# Patient Record
Sex: Female | Born: 1975 | Race: White | Hispanic: No | Marital: Married | Smoking: Never smoker
Health system: Southern US, Community
[De-identification: ages and names within clinical notes are randomized; demographics above are authoritative.]

## PROBLEM LIST (undated history)

## (undated) DIAGNOSIS — Z789 Other specified health status: Secondary | ICD-10-CM

## (undated) DIAGNOSIS — N83201 Unspecified ovarian cyst, right side: Secondary | ICD-10-CM

## (undated) DIAGNOSIS — R569 Unspecified convulsions: Secondary | ICD-10-CM

## (undated) DIAGNOSIS — H539 Unspecified visual disturbance: Secondary | ICD-10-CM

## (undated) DIAGNOSIS — E559 Vitamin D deficiency, unspecified: Secondary | ICD-10-CM

## (undated) DIAGNOSIS — K900 Celiac disease: Secondary | ICD-10-CM

## (undated) DIAGNOSIS — Z973 Presence of spectacles and contact lenses: Secondary | ICD-10-CM

## (undated) HISTORY — PX: MOUTH SURGERY: SHX715

## (undated) HISTORY — DX: Unspecified convulsions: R56.9

## (undated) HISTORY — DX: Unspecified visual disturbance: H53.9

---

## 2000-08-26 ENCOUNTER — Other Ambulatory Visit: Admission: RE | Admit: 2000-08-26 | Discharge: 2000-08-26 | Payer: Self-pay | Admitting: Obstetrics and Gynecology

## 2001-09-27 ENCOUNTER — Other Ambulatory Visit: Admission: RE | Admit: 2001-09-27 | Discharge: 2001-09-27 | Payer: Self-pay | Admitting: Obstetrics and Gynecology

## 2002-10-26 ENCOUNTER — Other Ambulatory Visit: Admission: RE | Admit: 2002-10-26 | Discharge: 2002-10-26 | Payer: Self-pay | Admitting: Obstetrics and Gynecology

## 2003-11-22 ENCOUNTER — Other Ambulatory Visit: Admission: RE | Admit: 2003-11-22 | Discharge: 2003-11-22 | Payer: Self-pay | Admitting: Obstetrics and Gynecology

## 2004-05-15 ENCOUNTER — Other Ambulatory Visit: Admission: RE | Admit: 2004-05-15 | Discharge: 2004-05-15 | Payer: Self-pay | Admitting: Obstetrics and Gynecology

## 2004-10-02 ENCOUNTER — Other Ambulatory Visit: Admission: RE | Admit: 2004-10-02 | Discharge: 2004-10-02 | Payer: Self-pay | Admitting: Obstetrics and Gynecology

## 2004-12-18 ENCOUNTER — Other Ambulatory Visit: Admission: RE | Admit: 2004-12-18 | Discharge: 2004-12-18 | Payer: Self-pay | Admitting: Obstetrics and Gynecology

## 2005-06-11 ENCOUNTER — Other Ambulatory Visit: Admission: RE | Admit: 2005-06-11 | Discharge: 2005-06-11 | Payer: Self-pay | Admitting: Obstetrics and Gynecology

## 2006-01-28 ENCOUNTER — Other Ambulatory Visit: Admission: RE | Admit: 2006-01-28 | Discharge: 2006-01-28 | Payer: Self-pay | Admitting: Obstetrics and Gynecology

## 2010-04-17 ENCOUNTER — Encounter: Admission: RE | Admit: 2010-04-17 | Discharge: 2010-04-17 | Payer: Self-pay | Admitting: Gastroenterology

## 2010-11-05 ENCOUNTER — Emergency Department (HOSPITAL_COMMUNITY)
Admission: EM | Admit: 2010-11-05 | Discharge: 2010-11-06 | Payer: Self-pay | Source: Home / Self Care | Admitting: Emergency Medicine

## 2010-11-05 LAB — URINALYSIS, ROUTINE W REFLEX MICROSCOPIC
Bilirubin Urine: NEGATIVE
Hgb urine dipstick: NEGATIVE
Nitrite: NEGATIVE
Protein, ur: NEGATIVE mg/dL
Specific Gravity, Urine: 1.012 (ref 1.005–1.030)
Urine Glucose, Fasting: NEGATIVE mg/dL

## 2010-11-05 LAB — COMPREHENSIVE METABOLIC PANEL
Calcium: 9.4 mg/dL (ref 8.4–10.5)
Chloride: 106 mEq/L (ref 96–112)
GFR calc non Af Amer: >60 mL/min (ref >60)
Glucose, Bld: 98 mg/dL (ref 70–99)
Potassium: 4.1 mEq/L (ref 3.5–5.1)
Total Protein: 7 g/dL (ref 6.0–8.3)

## 2010-11-05 LAB — URINE MICROSCOPIC-ADD ON

## 2010-11-05 LAB — DIFFERENTIAL
Basophils Relative: 0 % (ref 0–1)
Eosinophils Relative: 2 % (ref 0–5)
Monocytes Absolute: 0.8 10*3/uL (ref 0.1–1.0)
Neutro Abs: 5.8 10*3/uL (ref 1.7–7.7)
Neutrophils Relative %: 63 % (ref 43–77)

## 2010-11-05 LAB — CBC
MCV: 93 fL (ref 78.0–100.0)
Platelets: 224 10*3/uL (ref 150–400)
RBC: 4.55 MIL/uL (ref 3.87–5.11)

## 2011-12-21 ENCOUNTER — Encounter (HOSPITAL_COMMUNITY): Payer: Self-pay | Admitting: Emergency Medicine

## 2011-12-21 ENCOUNTER — Emergency Department (HOSPITAL_COMMUNITY)
Admission: EM | Admit: 2011-12-21 | Discharge: 2011-12-21 | Disposition: A | Discharge status: Home Health | Payer: BC Managed Care – PPO | Attending: Emergency Medicine | Admitting: Emergency Medicine

## 2011-12-21 DIAGNOSIS — Z0389 Encounter for observation for other suspected diseases and conditions ruled out: Secondary | ICD-10-CM | POA: Insufficient documentation

## 2011-12-21 DIAGNOSIS — K900 Celiac disease: Secondary | ICD-10-CM | POA: Insufficient documentation

## 2011-12-21 HISTORY — DX: Celiac disease: K90.0

## 2011-12-21 LAB — URINE MICROSCOPIC-ADD ON

## 2011-12-21 LAB — COMPREHENSIVE METABOLIC PANEL
ALT: 15 U/L (ref 0–35)
GFR calc non Af Amer: 88 mL/min — ABNORMAL LOW (ref >90)
Total Bilirubin: 0.1 mg/dL — ABNORMAL LOW (ref 0.3–1.2)

## 2011-12-21 LAB — URINALYSIS, ROUTINE W REFLEX MICROSCOPIC
Glucose, UA: NEGATIVE mg/dL
Hgb urine dipstick: NEGATIVE
Ketones, ur: 15 mg/dL — AB
Protein, ur: 100 mg/dL — AB
Specific Gravity, Urine: 1.034 — ABNORMAL HIGH (ref 1.005–1.030)
Urobilinogen, UA: 0.2 mg/dL (ref 0.0–1.0)

## 2011-12-21 LAB — CBC
Hemoglobin: 17 g/dL — ABNORMAL HIGH (ref 12.0–15.0)
MCHC: 34.4 g/dL (ref 30.0–36.0)
MCV: 93 fL (ref 78.0–100.0)
Platelets: 238 10*3/uL (ref 150–400)

## 2011-12-21 LAB — DIFFERENTIAL
Basophils Relative: 0 % (ref 0–1)
Eosinophils Absolute: 0 10*3/uL (ref 0.0–0.7)
Eosinophils Relative: 0 % (ref 0–5)
Lymphocytes Relative: 19 % (ref 12–46)
Monocytes Absolute: 0.8 10*3/uL (ref 0.1–1.0)
Monocytes Relative: 9 % (ref 3–12)

## 2011-12-21 LAB — POCT PREGNANCY, URINE: Preg Test, Ur: NEGATIVE

## 2011-12-21 NOTE — ED Notes (Signed)
Pt c/o N/V/D x 3 days; pt sts abd tenderness in RLQ

## 2014-08-22 ENCOUNTER — Encounter (HOSPITAL_COMMUNITY): Payer: Self-pay | Admitting: *Deleted

## 2014-08-23 ENCOUNTER — Encounter (HOSPITAL_COMMUNITY): Payer: Self-pay | Admitting: *Deleted

## 2014-08-23 NOTE — Progress Notes (Signed)
NPO AFTER MN. ARRIVE AT 0600. NEEDS HG AND URINE PREG.  PRE-OP ORDERS PENDING. 

## 2014-08-26 NOTE — H&P (Addendum)
Jordan Snyder is an 38 y.o. female. G0 with right ovarian mass, suspect dermoid, that presents for surgical mngt.  No pain.  Menses regular.    Patient's last menstrual period was 08/22/2014 (exact date).    Past Medical History  Diagnosis Date  . Celiac disease   . Right ovarian cyst   . Gluten free diet   . Vitamin D deficiency   . Wears contact lenses     History reviewed. No pertinent past surgical history.  History reviewed. No pertinent family history.  Social History:  reports that she has never smoked. She has never used smokeless tobacco. She reports that she drinks alcohol. She reports that she does not use illicit drugs.  Allergies: No Known Allergies  No prescriptions prior to admission    ROS  Height 5' 1"  (1.549 m), weight 65.772 kg (145 lb), last menstrual period 08/22/2014. Physical Exam  Gen - NAD Abd - soft, NT/ND Ext - NT, no edema PV - normal uterus, NT.  Fullness right adnexa  [place]:  Right ovarian mass, c/w probable dermoid.  Normal uterus.  No free fluid  Assessment/Plan: Right ovarian mass Laparoscopy with right ovarian cystectomy, possible oophorectomy R/b/a discussed, questions answered, informed consent  [name] 08/26/2014, 2:47 PM

## 2014-08-26 NOTE — Anesthesia Preprocedure Evaluation (Signed)
Anesthesia Evaluation  Patient identified by MRN, date of birth, ID band Patient awake    Reviewed: Allergy & Precautions, H&P , NPO status , Patient's Chart, lab work & pertinent test results  Airway Mallampati: II  TM Distance: >3 FB Neck ROM: Full    Dental no notable dental hx.    Pulmonary neg pulmonary ROS,  breath sounds clear to auscultation  Pulmonary exam normal       Cardiovascular negative cardio ROS  Rhythm:Regular Rate:Normal     Neuro/Psych negative neurological ROS  negative psych ROS   GI/Hepatic negative GI ROS, Neg liver ROS,   Endo/Other  negative endocrine ROS  Renal/GU negative Renal ROS     Musculoskeletal negative musculoskeletal ROS (+)   Abdominal   Peds  Hematology negative hematology ROS (+)   Anesthesia Other Findings   Reproductive/Obstetrics negative OB ROS                             Anesthesia Physical Anesthesia Plan  ASA: I  Anesthesia Plan: General   Post-op Pain Management:    Induction: Intravenous  Airway Management Planned: Oral ETT  Additional Equipment:   Intra-op Plan:   Post-operative Plan: Extubation in OR  Informed Consent: I have reviewed the patients History and Physical, chart, labs and discussed the procedure including the risks, benefits and alternatives for the proposed anesthesia with the patient or authorized representative who has indicated his/her understanding and acceptance.   Dental advisory given  Plan Discussed with: CRNA  Anesthesia Plan Comments:         Anesthesia Quick Evaluation

## 2014-08-27 ENCOUNTER — Ambulatory Visit (HOSPITAL_COMMUNITY)
Admission: RE | Admit: 2014-08-27 | Discharge: 2014-08-27 | Disposition: A | Discharge status: Home / Self Care | Payer: BC Managed Care – PPO | Source: Ambulatory Visit | Attending: Obstetrics and Gynecology | Admitting: Obstetrics and Gynecology

## 2014-08-27 ENCOUNTER — Encounter (HOSPITAL_BASED_OUTPATIENT_CLINIC_OR_DEPARTMENT_OTHER): Payer: Self-pay | Admitting: *Deleted

## 2014-08-27 ENCOUNTER — Ambulatory Visit (HOSPITAL_BASED_OUTPATIENT_CLINIC_OR_DEPARTMENT_OTHER): Payer: BC Managed Care – PPO | Admitting: Anesthesiology

## 2014-08-27 ENCOUNTER — Encounter (HOSPITAL_BASED_OUTPATIENT_CLINIC_OR_DEPARTMENT_OTHER)
Admission: RE | Disposition: A | Discharge status: Home / Self Care | Payer: Self-pay | Source: Ambulatory Visit | Attending: Obstetrics and Gynecology

## 2014-08-27 DIAGNOSIS — N839 Noninflammatory disorder of ovary, fallopian tube and broad ligament, unspecified: Secondary | ICD-10-CM | POA: Diagnosis present

## 2014-08-27 DIAGNOSIS — D270 Benign neoplasm of right ovary: Secondary | ICD-10-CM | POA: Insufficient documentation

## 2014-08-27 DIAGNOSIS — E559 Vitamin D deficiency, unspecified: Secondary | ICD-10-CM | POA: Diagnosis not present

## 2014-08-27 HISTORY — PX: LAPAROSCOPY: SHX197

## 2014-08-27 HISTORY — DX: Presence of spectacles and contact lenses: Z97.3

## 2014-08-27 HISTORY — DX: Unspecified ovarian cyst, right side: N83.201

## 2014-08-27 HISTORY — DX: Other specified health status: Z78.9

## 2014-08-27 HISTORY — DX: Vitamin D deficiency, unspecified: E55.9

## 2014-08-27 LAB — CBC
HCT: 41.4 % (ref 36.0–46.0)
Hemoglobin: 13.6 g/dL (ref 12.0–15.0)
MCH: 31.8 pg (ref 26.0–34.0)
MCHC: 32.9 g/dL (ref 30.0–36.0)
MCV: 96.7 fL (ref 78.0–100.0)
PLATELETS: 267 10*3/uL (ref 150–400)
RBC: 4.28 MIL/uL (ref 3.87–5.11)
RDW: 12.2 % (ref 11.5–15.5)
WBC: 8 10*3/uL (ref 4.0–10.5)

## 2014-08-27 LAB — POCT PREGNANCY, URINE: Preg Test, Ur: NEGATIVE

## 2014-08-27 SURGERY — LAPAROSCOPY, DIAGNOSTIC
Anesthesia: General | Site: Abdomen | Laterality: Right

## 2014-08-27 MED ORDER — CEFOTETAN DISODIUM-DEXTROSE 2-2.08 GM-% IV SOLR
INTRAVENOUS | Status: AC
  Filled 2014-08-27: qty 50

## 2014-08-27 MED ORDER — ONDANSETRON 4 MG PO TBDP
4.0000 mg | ORAL_TABLET | Freq: Once | ORAL | Status: DC
  Filled 2014-08-27 (×2): qty 1

## 2014-08-27 MED ORDER — GLYCOPYRROLATE 0.2 MG/ML IJ SOLN
INTRAMUSCULAR | Status: DC | PRN
  Administered 2014-08-27: 0.4 mg via INTRAVENOUS

## 2014-08-27 MED ORDER — FENTANYL CITRATE 0.05 MG/ML IJ SOLN
INTRAMUSCULAR | Status: DC | PRN
  Administered 2014-08-27: 50 ug via INTRAVENOUS
  Administered 2014-08-27: 25 ug via INTRAVENOUS
  Administered 2014-08-27: 50 ug via INTRAVENOUS
  Administered 2014-08-27 (×3): 25 ug via INTRAVENOUS

## 2014-08-27 MED ORDER — MIDAZOLAM HCL 5 MG/5ML IJ SOLN
INTRAMUSCULAR | Status: DC | PRN
  Administered 2014-08-27: 2 mg via INTRAVENOUS

## 2014-08-27 MED ORDER — DEXTROSE 5 % IV SOLN
2.0000 g | INTRAVENOUS | Status: AC
  Administered 2014-08-27: 2 g via INTRAVENOUS
  Filled 2014-08-27: qty 2

## 2014-08-27 MED ORDER — LACTATED RINGERS IV SOLN
INTRAVENOUS | Status: DC
Start: 2014-08-27 — End: 2014-08-27
  Administered 2014-08-27 (×2): via INTRAVENOUS
  Filled 2014-08-27: qty 1000

## 2014-08-27 MED ORDER — HYDROMORPHONE HCL 1 MG/ML IJ SOLN
0.2500 mg | INTRAMUSCULAR | Status: DC | PRN
  Filled 2014-08-27: qty 1

## 2014-08-27 MED ORDER — SUCCINYLCHOLINE CHLORIDE 20 MG/ML IJ SOLN
INTRAMUSCULAR | Status: DC | PRN
  Administered 2014-08-27: 100 mg via INTRAVENOUS

## 2014-08-27 MED ORDER — LIDOCAINE HCL (CARDIAC) 20 MG/ML IV SOLN
INTRAVENOUS | Status: DC | PRN
  Administered 2014-08-27: 50 mg via INTRAVENOUS

## 2014-08-27 MED ORDER — OXYCODONE HCL 5 MG PO TABS
5.0000 mg | ORAL_TABLET | ORAL | Status: DC | PRN
  Filled 2014-08-27: qty 1

## 2014-08-27 MED ORDER — DEXTROSE 5 % IV SOLN
INTRAVENOUS | Status: AC
  Filled 2014-08-27: qty 2

## 2014-08-27 MED ORDER — ROCURONIUM BROMIDE 100 MG/10ML IV SOLN
INTRAVENOUS | Status: DC | PRN
  Administered 2014-08-27: 25 mg via INTRAVENOUS

## 2014-08-27 MED ORDER — ONDANSETRON 4 MG PO TBDP
4.0000 mg | ORAL_TABLET | Freq: Once | ORAL | Status: AC
  Administered 2014-08-27: 4 mg via ORAL
  Filled 2014-08-27: qty 1

## 2014-08-27 MED ORDER — OXYCODONE HCL 5 MG PO TABS
5.0000 mg | ORAL_TABLET | Freq: Once | ORAL | Status: AC | PRN
  Administered 2014-08-27: 5 mg via ORAL
  Filled 2014-08-27: qty 1

## 2014-08-27 MED ORDER — ACETAMINOPHEN 10 MG/ML IV SOLN
INTRAVENOUS | Status: DC | PRN
  Administered 2014-08-27: 1000 mg via INTRAVENOUS

## 2014-08-27 MED ORDER — MEPERIDINE HCL 25 MG/ML IJ SOLN
6.2500 mg | INTRAMUSCULAR | Status: DC | PRN
  Filled 2014-08-27: qty 1

## 2014-08-27 MED ORDER — MIDAZOLAM HCL 2 MG/2ML IJ SOLN
INTRAMUSCULAR | Status: AC
  Filled 2014-08-27: qty 2

## 2014-08-27 MED ORDER — FENTANYL CITRATE 0.05 MG/ML IJ SOLN
INTRAMUSCULAR | Status: AC
  Filled 2014-08-27: qty 6

## 2014-08-27 MED ORDER — OXYCODONE HCL 5 MG/5ML PO SOLN
5.0000 mg | Freq: Once | ORAL | Status: AC | PRN
  Filled 2014-08-27: qty 5

## 2014-08-27 MED ORDER — IBUPROFEN 800 MG PO TABS: 800.0000 mg | ORAL_TABLET | Freq: Three times a day (TID) | ORAL | Status: DC | PRN

## 2014-08-27 MED ORDER — BUPIVACAINE HCL (PF) 0.25 % IJ SOLN
INTRAMUSCULAR | Status: DC | PRN
  Administered 2014-08-27: 7 mL

## 2014-08-27 MED ORDER — NEOSTIGMINE METHYLSULFATE 10 MG/10ML IV SOLN
INTRAVENOUS | Status: DC | PRN
  Administered 2014-08-27: 3 mg via INTRAVENOUS

## 2014-08-27 MED ORDER — PROPOFOL 10 MG/ML IV BOLUS
INTRAVENOUS | Status: DC | PRN
  Administered 2014-08-27: 200 mg via INTRAVENOUS

## 2014-08-27 MED ORDER — OXYCODONE-ACETAMINOPHEN 5-325 MG PO TABS: 1.0000 | ORAL_TABLET | ORAL | Status: DC | PRN

## 2014-08-27 MED ORDER — PROMETHAZINE HCL 25 MG/ML IJ SOLN
6.2500 mg | INTRAMUSCULAR | Status: DC | PRN
  Administered 2014-08-27: 6.25 mg via INTRAVENOUS
  Filled 2014-08-27: qty 1

## 2014-08-27 MED ORDER — ONDANSETRON HCL 4 MG/2ML IJ SOLN
INTRAMUSCULAR | Status: DC | PRN
  Administered 2014-08-27: 4 mg via INTRAVENOUS

## 2014-08-27 MED ORDER — DEXAMETHASONE SODIUM PHOSPHATE 4 MG/ML IJ SOLN
INTRAMUSCULAR | Status: DC | PRN
  Administered 2014-08-27: 8 mg via INTRAVENOUS

## 2014-08-27 MED ORDER — PROMETHAZINE HCL 25 MG/ML IJ SOLN
INTRAMUSCULAR | Status: AC
  Filled 2014-08-27: qty 1

## 2014-08-27 MED ORDER — OXYCODONE HCL 5 MG PO TABS
ORAL_TABLET | ORAL | Status: AC
  Filled 2014-08-27: qty 1

## 2014-08-27 SURGICAL SUPPLY — 65 items
ADH SKN CLS APL DERMABOND .7 (GAUZE/BANDAGES/DRESSINGS) ×1
APL SKNCLS STERI-STRIP NONHPOA (GAUZE/BANDAGES/DRESSINGS)
APPLICATOR COTTON TIP 6IN STRL (MISCELLANEOUS) ×3 IMPLANT
BAG SPEC RTRVL LRG 6X4 10 (ENDOMECHANICALS) ×1
BAG URINE DRAINAGE (UROLOGICAL SUPPLIES) IMPLANT
BANDAGE ADHESIVE 1X3 (GAUZE/BANDAGES/DRESSINGS) IMPLANT
BENZOIN TINCTURE PRP APPL 2/3 (GAUZE/BANDAGES/DRESSINGS) IMPLANT
BLADE CLIPPER SURG (BLADE) ×3 IMPLANT
BLADE SURG 11 STRL SS (BLADE) ×3 IMPLANT
CANISTER SUCTION 1200CC (MISCELLANEOUS) IMPLANT
CANISTER SUCTION 2500CC (MISCELLANEOUS) IMPLANT
CATH FOLEY 2WAY SLVR  5CC 16FR (CATHETERS)
CATH FOLEY 2WAY SLVR 5CC 16FR (CATHETERS) IMPLANT
CATH ROBINSON RED A/P 16FR (CATHETERS) ×3 IMPLANT
CLOSURE WOUND 1/2 X4 (GAUZE/BANDAGES/DRESSINGS)
CLOTH BEACON ORANGE TIMEOUT ST (SAFETY) ×3 IMPLANT
DERMABOND ADVANCED (GAUZE/BANDAGES/DRESSINGS) ×2
DERMABOND ADVANCED .7 DNX12 (GAUZE/BANDAGES/DRESSINGS) ×1 IMPLANT
DRAPE CAMERA CLOSED 9X96 (DRAPES) ×3 IMPLANT
DRAPE UNDERBUTTOCKS STRL (DRAPE) ×3 IMPLANT
ELECT REM PT RETURN 9FT ADLT (ELECTROSURGICAL) ×3
ELECTRODE REM PT RTRN 9FT ADLT (ELECTROSURGICAL) ×1 IMPLANT
GLOVE BIO SURGEON STRL SZ 6.5 (GLOVE) ×2 IMPLANT
GLOVE BIO SURGEON STRL SZ7 (GLOVE) ×3 IMPLANT
GLOVE BIO SURGEONS STRL SZ 6.5 (GLOVE) ×1
GLOVE BIOGEL M 6.5 STRL (GLOVE) ×2 IMPLANT
GLOVE BIOGEL PI IND STRL 6.5 (GLOVE) IMPLANT
GLOVE BIOGEL PI INDICATOR 6.5 (GLOVE) ×2
GLOVE INDICATOR 7.0 STRL GRN (GLOVE) ×3 IMPLANT
GOWN STRL REUS W/ TWL LRG LVL3 (GOWN DISPOSABLE) ×2 IMPLANT
GOWN STRL REUS W/TWL LRG LVL3 (GOWN DISPOSABLE) ×8 IMPLANT
GOWN STRL REUS W/TWL XL LVL3 (GOWN DISPOSABLE) ×2 IMPLANT
NDL HYPO 25X1 1.5 SAFETY (NEEDLE) ×1 IMPLANT
NDL INSUFFLATION 14GA 120MM (NEEDLE) IMPLANT
NDL INSUFFLATION 14GA 150MM (NEEDLE) IMPLANT
NEEDLE HYPO 25X1 1.5 SAFETY (NEEDLE) ×3 IMPLANT
NEEDLE INSUFFLATION 14GA 120MM (NEEDLE) IMPLANT
NEEDLE INSUFFLATION 14GA 150MM (NEEDLE) IMPLANT
NS IRRIG 500ML POUR BTL (IV SOLUTION) ×3 IMPLANT
PACK BASIN DAY SURGERY FS (CUSTOM PROCEDURE TRAY) ×3 IMPLANT
PACK LAPAROSCOPY II (CUSTOM PROCEDURE TRAY) ×3 IMPLANT
PAD OB MATERNITY 4.3X12.25 (PERSONAL CARE ITEMS) ×3 IMPLANT
PAD PREP 24X48 CUFFED NSTRL (MISCELLANEOUS) ×3 IMPLANT
PENCIL BUTTON HOLSTER BLD 10FT (ELECTRODE) IMPLANT
POUCH SPECIMEN RETRIEVAL 10MM (ENDOMECHANICALS) ×2 IMPLANT
SCALPEL HARMONIC ACE (MISCELLANEOUS) IMPLANT
SCISSORS LAP 5X35 DISP (ENDOMECHANICALS) IMPLANT
SEALER TISSUE G2 CVD JAW 45CM (ENDOMECHANICALS) ×2 IMPLANT
SET IRRIG TUBING LAPAROSCOPIC (IRRIGATION / IRRIGATOR) IMPLANT
SOLUTION ANTI FOG 6CC (MISCELLANEOUS) ×3 IMPLANT
STRIP CLOSURE SKIN 1/2X4 (GAUZE/BANDAGES/DRESSINGS) IMPLANT
SUT VIC AB 3-0 PS2 18 (SUTURE) ×3
SUT VIC AB 3-0 PS2 18XBRD (SUTURE) ×1 IMPLANT
SUT VICRYL 0 UR6 27IN ABS (SUTURE) ×3 IMPLANT
SYR 3ML 23GX1 SAFETY (SYRINGE) IMPLANT
SYR CONTROL 10ML LL (SYRINGE) ×3 IMPLANT
SYRINGE 10CC LL (SYRINGE) IMPLANT
TOWEL OR 17X24 6PK STRL BLUE (TOWEL DISPOSABLE) ×6 IMPLANT
TRAY DSU PREP LF (CUSTOM PROCEDURE TRAY) ×3 IMPLANT
TROCAR OPTI TIP 5M 100M (ENDOMECHANICALS) ×1 IMPLANT
TROCAR XCEL BLUNT TIP 100MML (ENDOMECHANICALS) IMPLANT
TROCAR XCEL NON-BLD 11X100MML (ENDOMECHANICALS) ×2 IMPLANT
TUBING INSUFFLATION 10FT LAP (TUBING) ×3 IMPLANT
VACUUM HOSE/TUBING 7/8INX6FT (MISCELLANEOUS) IMPLANT
WATER STERILE IRR 500ML POUR (IV SOLUTION) ×1 IMPLANT

## 2014-08-27 NOTE — Op Note (Signed)
[name], Jordan Snyder                   ACCOUNT NO.:  [id number]  MEDICAL RECORD NO.:  [id number]  LOCATION:  PERIO                         FACILITY:  [facility]  PHYSICIAN:  Marylynn Pearson, MD    DATE OF BIRTH:  18-Mar-1976  DATE OF PROCEDURE:  08/27/2014 DATE OF DISCHARGE:                              OPERATIVE REPORT   PREOPERATIVE DIAGNOSIS:  Right ovarian mass, suspect dermoid.  POSTOPERATIVE DIAGNOSIS:  Right ovarian dermoid.  SURGEON:  Marylynn Pearson, MD  PROCEDURE:  Laparoscopic right oophorectomy.  ANESTHESIA:  General.  COMPLICATIONS:  None.  SPECIMEN:  Right ovary.  CONDITION:  Stable to recovery room.  PROCEDURE IN DETAIL:  The patient was taken to the operating room after informed consent was obtained.  She was given general anesthesia, placed in the dorsal lithotomy position using Allen stirrups.  She was prepped and draped in sterile fashion.  An in and out catheter was used to drain her bladder.  Bivalve speculum was placed in the vagina.  Single-tooth tenaculum attached to the anterior lip of the cervix.  Acorn tenaculum was placed on the cervix.  Speculum was removed, and our attention was turned to the abdomen.  A 0.25% Marcaine was used to provide local anesthesia at the site of the infraumbilical incision and an umbilical incision was made with a scalpel.  This was extended to the fascia bluntly using a Kelly clamp.  Optical trocar was inserted under direct visualization.  Once intraperitoneal placement was confirmed, CO2 was turned on and the abdomen and pelvis were insufflated.  Right upper quadrant appeared normal.  Right ovary appeared enlarged.  Uterus, left adnexa, and right fallopian tube appeared normal.  A suprapubic incision was made with a scalpel, and a 10 mm trocar was inserted under direct visualization.  Blunt grasper was used to grasp the right ovary, tented upwards and towards the midline.  An instill device was used to excise the ovary, it was  placed in the posterior cul-de-sac.  An EndoCatch bag was placed in the pelvis and skipped the ovary, it was then removed through the suprapubic incision.  The fascia of both incisions were then closed with 0 Vicryl, and the skin was closed with 3-0 Vicryl. Dermabond was placed over both incisions.  Acorn and tenaculum were then removed from the cervix.  The ovary was dissected and noted to, in fact, be a dermoid.  The patient was then extubated and taken to the recovery room in stable condition. Sponge, lap, needle, and instrument counts were correct x2.     Marylynn Pearson, MD     GA/MEDQ  D:  08/27/2014  T:  08/27/2014  Job:  [id number]

## 2014-08-27 NOTE — Discharge Instructions (Signed)
[facility] office 2-3 weeks for postop appointment.  Call the office [phone] for an appointment.  Personal Hygiene: Use pads not tampons x 1week You may shower, no tub baths or pools for 2-3 weeks Wipe from front to back when using restroom  Activity: Do not drive or operate any equipment for 24 hrs.   Do not rest in bed all day Walking is encouraged Walk up and down stairs slowly You may return to your normal activity in 1-2 weeks  Sexual Activity:  No intercourse for 2 weeks after the procedure.  Diet: Eat a light meal as desired this evening.  You may resume your usual diet tomorrow.  Return to work:  You may resume your work activities after 1-2 weeks  What to expect:  Expect to have vaginal bleeding/discharge for 2-3 days and spotting for 10-14 days.  It is not unusual to have soreness for 1-2 weeks.  You may have a slight burning sensation when you urinate for the first few days.  You may start your menses in 2-6 weeks.  Mild cramps may continue for a couple of days.    Call your doctor:   Excessive bleeding, saturating a pad every hour Inability to urinate 6 hours after discharge Pain not relieved with pain medications Fever of 100.4 or greater   Post Anesthesia Home Care Instructions  Activity: Get plenty of rest for the remainder of the day. A responsible adult should stay with you for 24 hours following the procedure.  For the next 24 hours, DO NOT: -Drive a car -Paediatric nurse -Drink alcoholic beverages -Take any medication unless instructed by your physician -Make any legal decisions or sign important papers.  Meals: Start with liquid foods such as gelatin or soup. Progress to regular foods as tolerated. Avoid greasy, spicy, heavy foods. If nausea and/or vomiting occur, drink only clear liquids until the nausea and/or vomiting subsides. Call your physician if vomiting continues.  Special Instructions/Symptoms: Your throat may feel dry or sore from the anesthesia or  the breathing tube placed in your throat during surgery. If this causes discomfort, gargle with warm salt water. The discomfort should disappear within 24 hours.

## 2014-08-27 NOTE — Progress Notes (Signed)
Patient IV removed when patient stated she had no more nausea .Dr. Lissa Hoard in to visit patient stated she had nausea and zofran tablet ordered and given to patient

## 2014-08-27 NOTE — Anesthesia Procedure Notes (Signed)
Procedure Name: Intubation Date/Time: 08/27/2014 7:30 AM Performed by: Denna Haggard D Pre-anesthesia Checklist: Patient identified, Emergency Drugs available, Suction available and Patient being monitored Patient Re-evaluated:Patient Re-evaluated prior to inductionOxygen Delivery Method: Circle System Utilized Preoxygenation: Pre-oxygenation with 100% oxygen Intubation Type: IV induction Ventilation: Mask ventilation without difficulty Laryngoscope Size: Mac and 3 Grade View: Grade I Tube type: Oral Tube size: 7.0 mm Number of attempts: 1 Airway Equipment and Method: stylet,  oral airway and LTA kit utilized Placement Confirmation: ETT inserted through vocal cords under direct vision,  positive ETCO2 and breath sounds checked- equal and bilateral Secured at: 20 cm Tube secured with: Tape Dental Injury: Teeth and Oropharynx as per pre-operative assessment

## 2014-08-27 NOTE — Anesthesia Postprocedure Evaluation (Signed)
Anesthesia Post Note  Patient: Jordan Snyder  Procedure(s) Performed: Procedure(s) (LRB): LAPAROSCOPY DIAGNOSTIC,RIGHT OOPHORECTOMY (Right)  Anesthesia type: General  Patient location: PACU  Post pain: Pain level controlled  Post assessment: Post-op Vital signs reviewed  Last Vitals: BP 93/52 mmHg  Pulse 72  Temp(Src) 36.6 C (Oral)  Resp 17  Ht 5' 1"  (1.549 m)  Wt 142 lb (64.411 kg)  BMI 26.84 kg/m2  SpO2 99%  LMP 08/22/2014 (Exact Date)  Post vital signs: Reviewed  Level of consciousness: sedated  Complications: No apparent anesthesia complications

## 2014-08-27 NOTE — Transfer of Care (Signed)
Immediate Anesthesia Transfer of Care Note  Patient: Jordan Snyder  Procedure(s) Performed: Procedure(s) (LRB): LAPAROSCOPY DIAGNOSTIC,RIGHT OOPHORECTOMY (Right)  Patient Location: PACU  Anesthesia Type: General  Level of Consciousness: awake, oriented, sedated and patient cooperative  Airway & Oxygen Therapy: Patient Spontanous Breathing and Patient connected to face mask oxygen  Post-op Assessment: Report given to PACU RN and Post -op Vital signs reviewed and stable  Post vital signs: Reviewed and stable  Complications: No apparent anesthesia complications

## 2014-08-29 ENCOUNTER — Encounter (HOSPITAL_BASED_OUTPATIENT_CLINIC_OR_DEPARTMENT_OTHER): Payer: Self-pay | Admitting: Obstetrics and Gynecology

## 2014-12-25 ENCOUNTER — Other Ambulatory Visit: Payer: Self-pay | Admitting: Obstetrics and Gynecology

## 2017-09-06 ENCOUNTER — Encounter (HOSPITAL_COMMUNITY): Payer: Self-pay

## 2017-09-06 ENCOUNTER — Emergency Department (HOSPITAL_COMMUNITY): Payer: BC Managed Care – PPO

## 2017-09-06 ENCOUNTER — Emergency Department (HOSPITAL_COMMUNITY)
Admission: EM | Admit: 2017-09-06 | Discharge: 2017-09-06 | Disposition: A | Discharge status: Home / Self Care | Payer: BC Managed Care – PPO | Attending: Emergency Medicine | Admitting: Emergency Medicine

## 2017-09-06 DIAGNOSIS — R569 Unspecified convulsions: Secondary | ICD-10-CM | POA: Diagnosis present

## 2017-09-06 DIAGNOSIS — Z79899 Other long term (current) drug therapy: Secondary | ICD-10-CM | POA: Insufficient documentation

## 2017-09-06 LAB — I-STAT BETA HCG BLOOD, ED (MC, WL, AP ONLY): I-stat hCG, quantitative: <5 m[IU]/mL (ref <5)

## 2017-09-06 LAB — HEPATIC FUNCTION PANEL
ALK PHOS: 53 U/L (ref 38–126)
ALT: 17 U/L (ref 14–54)
AST: 29 U/L (ref 15–41)
Albumin: 3.7 g/dL (ref 3.5–5.0)
BILIRUBIN TOTAL: 0.5 mg/dL (ref 0.3–1.2)
Total Protein: 7.3 g/dL (ref 6.5–8.1)

## 2017-09-06 LAB — URINALYSIS, ROUTINE W REFLEX MICROSCOPIC
Bilirubin Urine: NEGATIVE
Glucose, UA: NEGATIVE mg/dL
HGB URINE DIPSTICK: NEGATIVE
Ketones, ur: NEGATIVE mg/dL
Leukocytes, UA: NEGATIVE
Nitrite: NEGATIVE
PROTEIN: NEGATIVE mg/dL
SPECIFIC GRAVITY, URINE: 1.009 (ref 1.005–1.030)
pH: 6 (ref 5.0–8.0)

## 2017-09-06 LAB — CBC
HCT: 42.2 % (ref 36.0–46.0)
HEMOGLOBIN: 13.7 g/dL (ref 12.0–15.0)
MCH: 31.4 pg (ref 26.0–34.0)
MCHC: 32.5 g/dL (ref 30.0–36.0)
MCV: 96.6 fL (ref 78.0–100.0)
PLATELETS: 279 10*3/uL (ref 150–400)
RBC: 4.37 MIL/uL (ref 3.87–5.11)
RDW: 12.3 % (ref 11.5–15.5)
WBC: 9 10*3/uL (ref 4.0–10.5)

## 2017-09-06 LAB — I-STAT TROPONIN, ED
TROPONIN I, POC: 0 ng/mL (ref 0.00–0.08)
Troponin i, poc: 0 ng/mL (ref 0.00–0.08)

## 2017-09-06 LAB — TSH: TSH: 4.057 u[IU]/mL (ref 0.350–4.500)

## 2017-09-06 LAB — D-DIMER, QUANTITATIVE: D-Dimer, Quant: 0.47 ug/mL-FEU (ref 0.00–0.50)

## 2017-09-06 LAB — BASIC METABOLIC PANEL
ANION GAP: 8 (ref 5–15)
BUN: 9 mg/dL (ref 6–20)
CHLORIDE: 106 mmol/L (ref 101–111)
CO2: 22 mmol/L (ref 22–32)
Calcium: 8.9 mg/dL (ref 8.9–10.3)
Creatinine, Ser: 1.1 mg/dL — ABNORMAL HIGH (ref 0.44–1.00)
GFR calc Af Amer: >60 mL/min (ref >60)
Glucose, Bld: 115 mg/dL — ABNORMAL HIGH (ref 65–99)
POTASSIUM: 3.6 mmol/L (ref 3.5–5.1)
SODIUM: 136 mmol/L (ref 135–145)

## 2017-09-06 LAB — CBG MONITORING, ED: GLUCOSE-CAPILLARY: 97 mg/dL (ref 65–99)

## 2017-09-06 LAB — RAPID URINE DRUG SCREEN, HOSP PERFORMED
AMPHETAMINES: NOT DETECTED
BENZODIAZEPINES: NOT DETECTED
Barbiturates: NOT DETECTED
COCAINE: NOT DETECTED
OPIATES: NOT DETECTED
TETRAHYDROCANNABINOL: NOT DETECTED

## 2017-09-06 LAB — ETHANOL: Alcohol, Ethyl (B): <10 mg/dL (ref <10)

## 2017-09-06 LAB — MAGNESIUM: MAGNESIUM: 1.8 mg/dL (ref 1.7–2.4)

## 2017-09-06 MED ORDER — ACETAMINOPHEN 500 MG PO TABS
1000.0000 mg | ORAL_TABLET | Freq: Once | ORAL | Status: AC
  Administered 2017-09-06: 1000 mg via ORAL
  Filled 2017-09-06: qty 2

## 2017-09-06 NOTE — ED Notes (Signed)
Patient transported to CT 

## 2017-09-06 NOTE — ED Notes (Signed)
Patient is resting comfortably with family at bedside.

## 2017-09-06 NOTE — ED Triage Notes (Signed)
Pt BIB [facility] form work with new onset seizure witnessed by coworkers. Pt found on the floor convulsing for about 5 mins. Upon EMS arrival pt was alert and oriented to self, pt became oriented x4 over about 5 mins with EMS. VSS, no trauma, no loss of bowel or bladder control. Pt a.ox4, nad CBG 110

## 2017-09-06 NOTE — ED Notes (Signed)
Main lab to add on magnesium, hepatic function panel, TSH, D-dimer and prolactin

## 2017-09-06 NOTE — ED Notes (Signed)
Pt aware of need for urine sample, unable to void at this time

## 2017-09-06 NOTE — ED Notes (Signed)
Pt transported to MRI 

## 2017-09-06 NOTE — ED Provider Notes (Signed)
[facility] EMERGENCY DEPARTMENT Provider Note   CSN: [id number] Arrival date & time: 09/06/17  1553     History   Chief Complaint Chief Complaint  Patient presents with  . Seizures    HPI Jordan Snyder is a 41 y.o. female.  HPI Patient's only medical history is celiac disease.  She reports she does not regularly take any kind of medications.  She felt well today and has not been ill recently.  She had lunch with coworkers and was back at the office at her desk.  Coworkers reports she was normal at lunch and they interacted for about 30 minutes after eating with no unusual symptoms.  Her coworker then heard a strange sound coming from her office, she went to check on her and found her lying behind her desk with some vomitus and frothy saliva in her mouth and convulsing in the upper body.  She reports she had a pale appearance and her lips were slightly blue.  Another coworker came in and assisted rolling the patient onto her side in recovery position.  She continue to some upper body convulsive type movements but improved over approximately the next 5 minutes while awaiting EMS.  As her color returned and her breathing normalized, patient's mental status returned to normal.  Patient recalls no preceding symptoms.  She reports she had not felt unwell in any way.  The last thing she recalled was being at her desk and getting ready to make some phone calls.  She has never had similar event.  Reports she has felt well with no problems with dizziness, visual changes, headaches, incoordination. Patient has light social drinking.  She drinks 1-3 wine over the weekend.  Occasional cocktail during the week but not frequently.  No drug use.  No smoking.  Family history negative for seizure disorder. Past Medical History:  Diagnosis Date  . Celiac disease   . Gluten free diet   . Right ovarian cyst   . Vitamin D deficiency   . Wears contact lenses     Patient Active Problem List     Diagnosis Date Noted  . Celiac disease     Past Surgical History:  Procedure Laterality Date  . LAPAROSCOPY Right 08/27/2014   Procedure: LAPAROSCOPY DIAGNOSTIC,RIGHT OOPHORECTOMY;  Surgeon: Marylynn Pearson, MD;  Location: [facility];  Service: Gynecology;  Laterality: Right;    OB History    No data available       Home Medications    Prior to Admission medications   Medication Sig Start Date End Date Taking? Authorizing Provider  Cholecalciferol (VITAMIN D3) 1000 UNITS CAPS Take 1 capsule by mouth daily.    [provider]  etonogestrel-ethinyl estradiol (NUVARING) 0.12-0.015 MG/24HR vaginal ring Place 1 each vaginally every 28 (twenty-eight) days. Insert vaginally and leave in place for 3 consecutive weeks, then remove for 1 week.    [provider]  ibuprofen (ADVIL,MOTRIN) 800 MG tablet Take 1 tablet (800 mg total) by mouth every 8 (eight) hours as needed. 08/27/14   Marylynn Pearson, MD  Multiple Vitamin (MULITIVITAMIN WITH MINERALS) TABS Take 1 tablet by mouth daily.    [provider]  Omega-3 Fatty Acids (FISH OIL) 1000 MG CAPS Take 1 capsule by mouth daily.    [provider]  oxyCODONE-acetaminophen (ROXICET) 5-325 MG per tablet Take 1-2 tablets by mouth every 4 (four) hours as needed for severe pain. 08/27/14   Marylynn Pearson, MD    Family History No  family history on file.  Social History Social History   Tobacco Use  . Smoking status: Never Smoker  . Smokeless tobacco: Never Used  Substance Use Topics  . Alcohol use: Yes    Comment: occasional  . Drug use: No     Allergies   Gluten meal   Review of Systems Review of Systems 10 Systems reviewed and are negative for acute change except as noted in the HPI.   Physical Exam Updated Vital Signs BP 112/64   Pulse 83   Temp 98.8 F (37.1 C) (Oral)   Resp (!) 21   Ht 5' 1"  (1.549 m)   LMP 08/28/2017   SpO2 100%   BMI 26.83 kg/m   Physical  Exam  Constitutional: She is oriented to person, place, and time. She appears well-developed and well-nourished. No distress.  HENT:  Head: Normocephalic and atraumatic.  Bilateral TMs normal.  Posterior oropharynx widely patent.  Dentition in good condition.  Patient does have mild tongue contusion and laceration.  Eyes: Conjunctivae are normal. Pupils are equal, round, and reactive to light.  Slight nystagmus bilaterally and horizontal direction.  Normal consensual pupillary responses  Neck: Neck supple.  Cardiovascular: Normal rate, regular rhythm, normal heart sounds and intact distal pulses.  No murmur heard. Pulmonary/Chest: Effort normal and breath sounds normal. No respiratory distress.  Abdominal: Soft. There is no tenderness.  Musculoskeletal: Normal range of motion. She exhibits no edema, tenderness or deformity.  Neurological: She is alert and oriented to person, place, and time. No cranial nerve deficit or sensory deficit. She exhibits normal muscle tone. Coordination normal.  No appreciable motor deficits.  Patient however appreciate some subjective increased effort for right-sided motor testing.  Finger nose exam normal bilaterally.  Heel-to-shin exam bilateral normal speech and cognitive function normal.  Skin: Skin is warm and dry.  Psychiatric: She has a normal mood and affect.  Nursing note and vitals reviewed.    ED Treatments / Results  Labs (all labs ordered are listed, but only abnormal results are displayed) Labs Reviewed  BASIC METABOLIC PANEL - Abnormal; Notable for the following components:      Result Value   Glucose, Bld 115 (*)    Creatinine, Ser 1.10 (*)    All other components within normal limits  URINALYSIS, ROUTINE W REFLEX MICROSCOPIC - Abnormal; Notable for the following components:   Color, Urine STRAW (*)    APPearance HAZY (*)    All other components within normal limits  HEPATIC FUNCTION PANEL - Abnormal; Notable for the following components:    Bilirubin, Direct <0.1 (*)    All other components within normal limits  CBC  RAPID URINE DRUG SCREEN, HOSP PERFORMED  ETHANOL  D-DIMER, QUANTITATIVE (NOT AT [facility])  MAGNESIUM  TSH  PROLACTIN  CBG MONITORING, ED  I-STAT BETA HCG BLOOD, ED ([facility], [facility], [facility] ONLY)  I-STAT TROPONIN, ED  I-STAT TROPONIN, ED    EKG  EKG Interpretation None       Radiology Ct Head Wo Contrast  Result Date: 09/06/2017 CLINICAL DATA:  Headache.  New onset seizure. EXAM: CT HEAD WITHOUT CONTRAST TECHNIQUE: Contiguous axial images were obtained from the base of the skull through the vertex without intravenous contrast. COMPARISON:  None. FINDINGS: Brain: No evidence of acute infarction, hemorrhage, hydrocephalus, extra-axial collection or mass lesion/mass effect. Vascular: No hyperdense vessel or unexpected calcification. Skull: Normal. Negative for fracture or focal lesion. Sinuses/Orbits: No acute finding. Other: None. IMPRESSION: 1. Normal noncontrast head CT. Electronically Signed  By: Titus Dubin M.D.   On: 09/06/2017 17:53   Mr Brain Wo Contrast (neuro Protocol)  Result Date: 09/06/2017 CLINICAL DATA:  41 y/o  F; new witnessed seizure.  Initial exam. EXAM: MRI HEAD WITHOUT CONTRAST TECHNIQUE: Multiplanar, multiecho pulse sequences of the brain and surrounding structures were obtained without intravenous contrast. COMPARISON:  09/06/2017 CT head. FINDINGS: Brain: No acute infarction, hemorrhage, hydrocephalus, extra-axial collection or mass lesion. No disorder of cortical formation, cortical dysplasia, or heterotopia. Morphologically normal pituitary, corpus callosum, and vermis. Hippocampi are symmetric in size and signal. Vascular: Normal flow voids. Skull and upper cervical spine: Normal marrow signal. Sinuses/Orbits: Negative. Other: None. IMPRESSION: 1. No structural cause of seizure identified. No acute intracranial abnormality 2. Unremarkable MRI of the brain for age. Electronically Signed   By:  Kristine Garbe M.D.   On: 09/06/2017 20:56    Procedures Procedures (including critical care time)  Medications Ordered in ED Medications  acetaminophen (TYLENOL) tablet 1,000 mg (1,000 mg Oral Given 09/06/17 1750)     Initial Impression / Assessment and Plan / ED Course  I have reviewed the triage vital signs and the nursing notes.  Pertinent labs & imaging results that were available during my care of the patient were reviewed by me and considered in my medical decision making (see chart for details).    Consult: Reviewed with Dr. Malen Gauze neurology.  Advises is ideal to obtain MRI.  If negative and other diagnostic workup within normal limits, appropriate for outpatient follow-up with neurology for EEG. Final Clinical Impressions(s) / ED Diagnoses   Final diagnoses:  New onset seizure [facility])   Patient presented as outlined above.  Bystanders who are very familiar with the patient describes she was completely well before episode.  Patient reports she had no preceding symptoms.  She was witnessed to have tonic-clonic seizure activity.  At this time there is no apparent etiology.  Diagnostic workup has been negative.  Patient's neurologic examination and mental status are normal.  She is counseled on follow-up plan with neurology for outpatient EEG.  She is made aware aware of necessity for no driving, no dangerous activities that could result in injury of seizure occurred.  She is counseled on return precautions to the emergency department for any unusual symptoms or other concerns. ED Discharge Orders    None       Charlesetta Shanks, MD 09/07/17 0000

## 2017-09-06 NOTE — Discharge Instructions (Signed)
1.  You may not drive a car or any other equipment that could result in injury until you have been seen by neurology and had your follow-up EEG. 2.  No activities that could result in injury if you have another seizure.  No climbing on ladders or high places that could result in a fall.  No swimming getting in hot tubs. 3.  No alcohol until you have had your follow-up appointments completed. 4.  Return to the emergency department if you have any recurrence of seizure, do not feel well or developing new or unusual symptoms.

## 2017-09-07 ENCOUNTER — Ambulatory Visit: Payer: BC Managed Care – PPO | Admitting: Neurology

## 2017-09-07 DIAGNOSIS — R569 Unspecified convulsions: Secondary | ICD-10-CM

## 2017-09-07 NOTE — Procedures (Signed)
    History:  Jordan Snyder is a 41 year old patient who had a witnessed seizure event on 06 September 2017.  The patient was at work at the time, she was noted to have jerking of the upper extremities with vomiting.  The patient is being evaluated for this event.  This is a routine EEG.  No skull defects are noted.  Medications include vitamin D, ibuprofen, multivitamins, and oxycodone.  EEG classification: Normal awake  Description of the recording: The background rhythms of this recording consists of a fairly well modulated medium amplitude alpha rhythm of 9 Hz that is reactive to eye opening and closure. As the record progresses, the patient appears to remain in the waking state throughout the recording. Photic stimulation was performed, resulting in a bilateral and symmetric photic driving response. Hyperventilation was also performed, resulting in a minimal buildup of the background rhythm activities without significant slowing seen. At no time during the recording does there appear to be evidence of spike or spike wave discharges or evidence of focal slowing. EKG monitor shows no evidence of cardiac rhythm abnormalities with a heart rate of 72.  Impression: This is a normal EEG recording in the waking state. No evidence of ictal or interictal discharges are seen.

## 2017-09-08 ENCOUNTER — Telehealth: Payer: Self-pay | Admitting: Neurology

## 2017-09-08 LAB — PROLACTIN: Prolactin: 71.7 ng/mL — ABNORMAL HIGH (ref 4.8–23.3)

## 2017-09-08 NOTE — Telephone Encounter (Signed)
I called the patient.  The EEG study was normal.  The patient has not been set up for a new patient evaluation anywhere, she will need to be seen in our office as a new patient for evaluation of seizures.

## 2017-09-08 NOTE — Telephone Encounter (Signed)
Pt asking for a call with the EEG results as soon as possible because until this is resolved she is unable to work or drive

## 2017-09-18 ENCOUNTER — Telehealth: Payer: Self-pay | Admitting: *Deleted

## 2017-09-18 NOTE — Telephone Encounter (Signed)
Patient's appt had to be canceled on 09/19/17 due to inclement weather.  She has a pending new patient appt with Dr. Felecia Shelling on 09/29/17.

## 2017-09-19 ENCOUNTER — Ambulatory Visit: Payer: BC Managed Care – PPO | Admitting: Neurology

## 2017-09-29 ENCOUNTER — Ambulatory Visit: Payer: BC Managed Care – PPO | Admitting: Neurology

## 2017-09-29 ENCOUNTER — Encounter: Payer: Self-pay | Admitting: Neurology

## 2017-09-29 VITALS — BP 128/86 | HR 89 | Resp 14 | Ht 62.0 in | Wt 164.0 lb

## 2017-09-29 DIAGNOSIS — K900 Celiac disease: Secondary | ICD-10-CM | POA: Diagnosis not present

## 2017-09-29 DIAGNOSIS — R569 Unspecified convulsions: Secondary | ICD-10-CM | POA: Insufficient documentation

## 2017-09-29 DIAGNOSIS — G4719 Other hypersomnia: Secondary | ICD-10-CM

## 2017-09-29 DIAGNOSIS — R0683 Snoring: Secondary | ICD-10-CM | POA: Diagnosis not present

## 2017-09-29 NOTE — Progress Notes (Signed)
[facility]  PATIENT: Jordan Snyder DOB: 04-Jun-1976  REFERRING DOCTOR OR PCP:  Dr. Julien Girt SOURCE: patient, notes from PCP and ED, labs and imaging reports, T8 MRI images on PACS.  _________________________________   HISTORICAL  CHIEF COMPLAINT:  Chief Complaint  Patient presents with  . Seizures    New onset sz. Had first sz. 09/06/17 while at work.  Tonic-clonic activity witnessed by coworkers.  No prior illness, sleep deprivation, other triggering factors. No episodes since.  Not on any sz. med. Hilton Cork    HISTORY OF PRESENT ILLNESS:  I had the pleasure seeing you patient, Jordan Snyder, at [facility] for neurologic consultation regarding her single seizure on 09/06/2017. She is generally in good health with celiac disease being her only issue. She will she is not on any medications and does not use illicit drugs. She was in her usual state of good health and had a normal lunch with her coworkers. Shortly thereafter in the office a call for her further strange sound and came in and saw her lying behind her desk with convulsions in the upper body and frothy saliva at the mouth. The lips were slightly blue. She was rolled on her side and continued to have some upper body convulsive movements for a few more minutes she began to improve before EMS arrived and did better after oxygen mask was placed.      She has no recollection until she was in the ambulance.  She was confused and communicative and was back to her baseline about 30-60 minutes later.     She had additional labwork a few days a  She snores more since the seizure and feels more fatigued.   She does not wake up with a headache though may have some later in the day.    She has gained 12-15 pounds.     EPWORTH SLEEPINESS SCALE  On a scale of 0 - 3 what is the chance of dozing:  Sitting and Reading:   3 Watching TV:    3 Sitting inactive in a public place: 0 Passenger in car for one  hour: 3 Lying down to rest in the afternoon: 3 Sitting and talking to someone: 0 Sitting quietly after lunch:  0 In a car, stopped in traffic:  0  Total (out of 24):   12/24  (mild EDS)   She presented to the emergency department 09/06/2017. I reviewed the notes, labs and imaging reports. Labs were normal except for an elevated prolactin consistent with a recent seizure. CT scan and MRI was essentially normal. I personally reviewed the images and concur.  I also reviewed the EEG performed 09/07/2017. It is normal.  There is no family history of seizures. She had a normal birth and early childhood. There is no history of meningitis or encephalitis. She never had any significant head trauma.   She did have a concussion at age 17 without loss of consciousness.  REVIEW OF SYSTEMS: Constitutional: No fevers, chills, sweats, or change in appetite Eyes: No visual changes, double vision, eye pain Ear, nose and throat: No hearing loss, ear pain, nasal congestion, sore throat Cardiovascular: No chest pain, palpitations Respiratory: No shortness of breath at rest or with exertion.   No wheezes GastrointestinaI: No nausea, vomiting, diarrhea, abdominal pain, fecal incontinence Genitourinary: No dysuria, urinary retention or frequency.  No nocturia. Musculoskeletal: No neck pain, back pain Integumentary: No rash, pruritus, skin lesions Neurological: as above Psychiatric: No depression at this time.  No anxiety Endocrine: No palpitations, diaphoresis, change in appetite, change in weigh or increased thirst Hematologic/Lymphatic: No anemia, purpura, petechiae. Allergic/Immunologic: No itchy/runny eyes, nasal congestion, recent allergic reactions, rashes  ALLERGIES: Allergies  Allergen Reactions  . Gluten Meal     HOME MEDICATIONS:  Current Outpatient Medications:  .  Cholecalciferol (VITAMIN D3) 1000 UNITS CAPS, Take 1 capsule by mouth daily., Disp: , Rfl:  .  ibuprofen (ADVIL,MOTRIN) 800  MG tablet, Take 1 tablet (800 mg total) by mouth every 8 (eight) hours as needed., Disp: 30 tablet, Rfl: 0 .  Multiple Vitamin (MULITIVITAMIN WITH MINERALS) TABS, Take 1 tablet by mouth daily., Disp: , Rfl:  .  Omega-3 Fatty Acids (FISH OIL) 1000 MG CAPS, Take 1 capsule by mouth daily., Disp: , Rfl:  .  etonogestrel-ethinyl estradiol (NUVARING) 0.12-0.015 MG/24HR vaginal ring, Place 1 each vaginally every 28 (twenty-eight) days. Insert vaginally and leave in place for 3 consecutive weeks, then remove for 1 week., Disp: , Rfl:  .  oxyCODONE-acetaminophen (ROXICET) 5-325 MG per tablet, Take 1-2 tablets by mouth every 4 (four) hours as needed for severe pain., Disp: 30 tablet, Rfl: 0  PAST MEDICAL HISTORY: Past Medical History:  Diagnosis Date  . Celiac disease   . Gluten free diet   . Right ovarian cyst   . Seizures ([place])   . Vision abnormalities   . Vitamin D deficiency   . Wears contact lenses     PAST SURGICAL HISTORY: Past Surgical History:  Procedure Laterality Date  . LAPAROSCOPY Right 08/27/2014   Procedure: LAPAROSCOPY DIAGNOSTIC,RIGHT OOPHORECTOMY;  Surgeon: Marylynn Pearson, MD;  Location: [facility];  Service: Gynecology;  Laterality: Right;  . MOUTH SURGERY      FAMILY HISTORY: Family History  Problem Relation Age of Onset  . Healthy Mother   . Diabetes Mellitus II Father   . Heart attack Father   . Healthy Brother     SOCIAL HISTORY:  Social History   Socioeconomic History  . Marital status: Divorced    Spouse name: Not on file  . Number of children: Not on file  . Years of education: Not on file  . Highest education level: Not on file  Social Needs  . Financial resource strain: Not on file  . Food insecurity - worry: Not on file  . Food insecurity - inability: Not on file  . Transportation needs - medical: Not on file  . Transportation needs - non-medical: Not on file  Occupational History  . Not on file  Tobacco Use  . Smoking status:  Never Smoker  . Smokeless tobacco: Never Used  Substance and Sexual Activity  . Alcohol use: Yes    Comment: occasional  . Drug use: No  . Sexual activity: Yes  Other Topics Concern  . Not on file  Social History Narrative  . Not on file     PHYSICAL EXAM  Vitals:   09/29/17 0901  BP: 128/86  Pulse: 89  Resp: 14  Weight: 164 lb (74.4 kg)  Height: 5' 2"  (1.575 m)    Body mass index is 30 kg/m.   General: The patient is well-developed and well-nourished and in no acute distress  Eyes:  Funduscopic exam shows normal optic discs and retinal vessels.  Neck: The neck is supple, no carotid bruits are noted.  The neck is nontender.  Cardiovascular: The heart has a regular rate and rhythm with a normal S1 and S2. There were no murmurs, gallops or rubs. Lungs  are clear to auscultation.  Skin: Extremities are without significant edema.  Musculoskeletal:  Back is nontender  Neurologic Exam  Mental status: The patient is alert and oriented x 3 at the time of the examination. The patient has apparent normal recent and remote memory, with an apparently normal attention span and concentration ability.   Speech is normal.  Cranial nerves: Extraocular movements are full. Pupils are equal, round, and reactive to light and accomodation.  Visual fields are full.  Facial symmetry is present. There is good facial sensation to soft touch bilaterally.Facial strength is normal.  Trapezius and sternocleidomastoid strength is normal. No dysarthria is noted.  The tongue is midline, and the patient has symmetric elevation of the soft palate. No obvious hearing deficits are noted.  Motor:  Muscle bulk is normal.   Tone is normal. Strength is  5 / 5 in all 4 extremities.   Sensory: Sensory testing is intact to pinprick, soft touch and vibration sensation in all 4 extremities.  Coordination: Cerebellar testing reveals good finger-nose-finger and heel-to-shin bilaterally.  Gait and station:  Station is normal.   Gait is normal. Tandem gait is normal. Romberg is negative.   Reflexes: Deep tendon reflexes are symmetric and normal bilaterally.   Plantar responses are flexor.    DIAGNOSTIC DATA (LABS, IMAGING, TESTING) - I reviewed patient records, labs, notes, testing and imaging myself where available.  Lab Results  Component Value Date   WBC 9.0 09/06/2017   HGB 13.7 09/06/2017   HCT 42.2 09/06/2017   MCV 96.6 09/06/2017   PLT 279 09/06/2017      Component Value Date/Time   NA 136 09/06/2017 1609   K 3.6 09/06/2017 1609   CL 106 09/06/2017 1609   CO2 22 09/06/2017 1609   GLUCOSE 115 (H) 09/06/2017 1609   BUN 9 09/06/2017 1609   CREATININE 1.10 (H) 09/06/2017 1609   CALCIUM 8.9 09/06/2017 1609   PROT 7.3 09/06/2017 1615   ALBUMIN 3.7 09/06/2017 1615   AST 29 09/06/2017 1615   ALT 17 09/06/2017 1615   ALKPHOS 53 09/06/2017 1615   BILITOT 0.5 09/06/2017 1615   GFRNONAA >60 09/06/2017 1609   GFRAA >60 09/06/2017 1609    Lab Results  Component Value Date   TSH 4.057 09/06/2017       ASSESSMENT AND PLAN  Seizures (HCC)  Snoring  Celiac disease  Excessive daytime sleepiness   In summary, Krishika Crisp is a 41 year old woman who had a single unprovoked seizure. She does not really have any seizure risk factor and was not on any medication that might have lowered her seizure threshold. Her EEG was normal. The MRI was normal. Her examination was normal. We had a discussion about her seizure. For the time being we will keep her off of an antiepileptic agent but if she has another seizure she would need to go on. Per state law she will be unable to drive until May. One possible risk factor for her having a seizure could be poor sleep and she does snore with excessive daytime sleepiness. Therefore, we will check a home sleep study to determine if she has sleep apnea but needs to be treated.  She will return to see me in about 5 months or sooner if there are new or  worsening neurologic symptoms.  Thank you for asking me to see Ms. Doyle Askew. Please let me know if I can be of further assistance with her or other patients in the future.   Richard  August Saucer, MD, [facility] 11/73/5670, 1:41 PM Certified in Neurology, Clinical Neurophysiology, Sleep Medicine, Pain Medicine and Neuroimaging  [facility] [address], [place], [place] [address] [phone]

## 2017-10-05 ENCOUNTER — Telehealth: Payer: Self-pay | Admitting: Neurology

## 2017-10-05 NOTE — Telephone Encounter (Signed)
Error

## 2017-10-19 ENCOUNTER — Telehealth: Payer: Self-pay | Admitting: *Deleted

## 2017-10-19 NOTE — Telephone Encounter (Signed)
[place] (identified vm).  I have just a couple of questions regarding the FMLA paperwork she dropped off/fim

## 2017-10-19 NOTE — Telephone Encounter (Signed)
Pt is returned call, is aware that Kyra Searles will call her back either later today 10/19/16 or tomorrow 10/20/17

## 2017-10-20 DIAGNOSIS — Z0289 Encounter for other administrative examinations: Secondary | ICD-10-CM

## 2017-10-20 NOTE — Telephone Encounter (Signed)
Spoke with Jordan Snyder.  She is asking for FMLA allowance for MD appt's in other offices, to investigate cause of sz. activity.  Sts. she is not able to return to our office for home sleep test due to transportation issues (not able to drive for 6 mos. b/c of sz. activity.)  She is trying to get in with another sleep clinic.  We do not usually do FMLA paperwork to cover appt's in other offices, but [facility] has offered 4-6 1/2 day appt's over the next year, for MD appt's to eval sz. activity.  Pt. is agreeable.  Form completed and sent to [facility]. [facility]. to be distributed per pt's wishes/fim

## 2017-10-25 ENCOUNTER — Telehealth: Payer: Self-pay | Admitting: Neurology

## 2017-10-25 NOTE — Telephone Encounter (Signed)
We have attempted to call the patient two times to schedule sleep study. Patient has been unavailable at the phone numbers we have on file, and has not returned our calls. At this time, we will send a letter asking patient to please contact the sleep lab.

## 2017-12-06 ENCOUNTER — Ambulatory Visit: Payer: BC Managed Care – PPO | Admitting: Allergy and Immunology

## 2017-12-06 ENCOUNTER — Encounter: Payer: Self-pay | Admitting: Allergy and Immunology

## 2017-12-06 VITALS — BP 130/84 | HR 80 | Temp 97.7°F | Resp 20 | Ht 62.0 in | Wt 167.0 lb

## 2017-12-06 DIAGNOSIS — R140 Abdominal distension (gaseous): Secondary | ICD-10-CM | POA: Diagnosis not present

## 2017-12-06 DIAGNOSIS — R232 Flushing: Secondary | ICD-10-CM

## 2017-12-06 DIAGNOSIS — R5383 Other fatigue: Secondary | ICD-10-CM

## 2017-12-06 DIAGNOSIS — R635 Abnormal weight gain: Secondary | ICD-10-CM

## 2017-12-06 DIAGNOSIS — R42 Dizziness and giddiness: Secondary | ICD-10-CM

## 2017-12-06 DIAGNOSIS — I9589 Other hypotension: Secondary | ICD-10-CM | POA: Diagnosis not present

## 2017-12-06 NOTE — Progress Notes (Addendum)
Dear Jordan Snyder,  Thank you for referring Jordan Snyder to the [facility] on 12/06/2017.   Below is a summation of this patient's evaluation and recommendations.  Thank you for your referral. I will keep you informed about this patient's response to treatment.   If you have any questions please do not hesitate to contact me.   Sincerely,  Jiles Prows, MD Allergy / Immunology [place]   ______________________________________________________________________    NEW PATIENT NOTE  Referring Provider: Kerry Dory, NP Primary Provider: Fanny Bien, MD Date of office visit: 12/06/2017    Subjective:   Chief Complaint:  Jordan Snyder (DOB: May 14, 1976) is a 42 y.o. female who presents to the clinic on 12/06/2017 with a chief complaint of Fatigue; Dizziness; Headache; Weight Gain; and Celiac Disease .     HPI: Jordan Snyder presents to this clinic in evaluation of problems that developed after onset of a new onset seizure disorder November 2018.  Apparently she had a seizure in November 2018 the details of which she cannot remember and with evaluation which has not led to a specific etiologic agent responsible for the seizure.  She has not been treated with any type of antiseizure medications at this point.  What she has noticed since that event is several issues as noted below:  First, she has "fatigue".  She just does not have energy to make it through the day and she is exhausted by the end of the day.  She feels refreshed in the morning and overall has a very good night of sleep without any fractured sleep but by the end of the day she is very tired and in fact when she can take a nap on the weekends she will do so.  Second, she complains of being "unsteady" intermittently.  This appears to occur when she stands for a prolonged period in time.  This occurs on a daily basis although  the frequency of these episodes may be somewhat diminished over the course of the past week or so to 3-4 times per week.  According to her husband she may get a little flushed and then she gets completely white and she gets unsteady and she must hold onto something to prevent herself from falling.  It is difficult to say exactly how long these episodes last but they usually last more than several minutes.  Usually she needs to sit down for relief.  Third, she feels as though she is "bloated".  Her abdomen is bloated and she hears her abdomen making lots of noises.  She does have a history of celiac disease and she is about 90% free from consuming gluten.  She does not have any heartburn associated with this issue.  Fourth, she has gained 15 pounds of weight since that event.  She was exercising as best that she could during this 21-monthinterval but she still appears to be gaining weight.  There is not really any new environmental exposure or new medication use that can explain all of the issues described above.  Past Medical History:  Diagnosis Date  . Celiac disease   . Gluten free diet   . Right ovarian cyst   . [place]   . Vision abnormalities   . Vitamin D deficiency   . Wears contact lenses     Past Surgical History:  Procedure Laterality Date  . LAPAROSCOPY Right 08/27/2014  Procedure: LAPAROSCOPY DIAGNOSTIC,RIGHT OOPHORECTOMY;  Surgeon: Marylynn Pearson, MD;  Location: [facility];  Service: Gynecology;  Laterality: Right;  . MOUTH SURGERY      Allergies as of 12/06/2017      Reactions   Gluten Meal       Medication List      ibuprofen 800 MG tablet Commonly known as:  ADVIL,MOTRIN Take 1 tablet (800 mg total) by mouth every 8 (eight) hours as needed.   multivitamin with minerals Tabs tablet Take 1 tablet by mouth daily.   norethindrone 0.35 MG tablet Commonly known as:  MICRONOR,CAMILA,ERRIN Take 1 tablet by mouth daily.   Vitamin D3 1000  units Caps Take 1 capsule by mouth daily.       Review of systems negative except as noted in HPI / PMHx or noted below:  Review of Systems  Constitutional: Negative.   HENT: Negative.   Eyes: Negative.   Respiratory: Negative.   Cardiovascular: Negative.   Gastrointestinal: Negative.   Genitourinary: Negative.   Musculoskeletal: Negative.   Skin: Negative.   Neurological: Negative.   Endo/Heme/Allergies: Negative.   Psychiatric/Behavioral: Negative.     Family History  Problem Relation Age of Onset  . Healthy Mother   . Diabetes Mellitus II Father   . Heart attack Father   . Healthy Brother   . Breast cancer Maternal Grandmother   . Alzheimer's disease Maternal Grandmother   . Alzheimer's disease Paternal Grandmother   . Liver cancer Paternal Grandfather     Social History   Socioeconomic History  . Marital status: Divorced    Spouse name: Not on file  . Number of children: Not on file  . Years of education: Not on file  . Highest education level: Not on file  Social Needs  . Financial resource strain: Not on file  . Food insecurity - worry: Not on file  . Food insecurity - inability: Not on file  . Transportation needs - medical: Not on file  . Transportation needs - non-medical: Not on file  Occupational History  . Not on file  Tobacco Use  . Smoking status: Never Smoker  . Smokeless tobacco: Never Used  Substance and Sexual Activity  . Alcohol use: Yes    Alcohol/week: 3.0 oz    Types: 5 Glasses of wine per week    Comment: occasional  . Drug use: No  . Sexual activity: Yes  Other Topics Concern  . Not on file  Social History Narrative  . Not on file    Environmental and Social history  Lives in a house with a dry environment, a dog located in the house, carpet in the bedroom, no plastic on the bed, no plastic on the pillow, no smokers located inside the household and employment as an Nurse, children's.  Objective:   Vitals:   12/06/17 1347    BP: 130/84  Pulse: 80  Resp: 20  Temp: 97.7 F (36.5 C)   Height: 5' 2"  (157.5 cm) Weight: 167 lb (75.8 kg)  Physical Exam  Constitutional: She is well-developed, well-nourished, and in no distress.  HENT:  Head: Normocephalic.  Right Ear: Tympanic membrane, external ear and ear canal normal.  Left Ear: Tympanic membrane, external ear and ear canal normal.  Nose: Nose normal. No mucosal edema or rhinorrhea.  Mouth/Throat: Uvula is midline, oropharynx is clear and moist and mucous membranes are normal. No oropharyngeal exudate.  Eyes: Conjunctivae are normal.  Neck: Trachea normal. No tracheal tenderness present. No tracheal  deviation present. No thyromegaly present.  Cardiovascular: Normal rate, regular rhythm, S1 normal, S2 normal and normal heart sounds.  No murmur heard. Pulmonary/Chest: Breath sounds normal. No stridor. No respiratory distress. She has no wheezes. She has no rales.  Musculoskeletal: She exhibits no edema.  Lymphadenopathy:       Head (right side): No tonsillar adenopathy present.       Head (left side): No tonsillar adenopathy present.    She has no cervical adenopathy.  Neurological: She is alert. Gait normal.  Skin: No rash noted. She is not diaphoretic. No erythema. Nails show no clubbing.  Psychiatric: Mood and affect normal.    Diagnostics:   Results of blood tests obtained 06 September 2018 identified normal hepatic and renal function, WBC 9.0, hemoglobin 13.7, platelet 279, prolactin 71.7 ng/ML, TSH 4.05 IU/mL.  Results of a brain MRI obtained 06 September 2017 identified the following:  Brain: No acute infarction, hemorrhage, hydrocephalus, extra-axial collection or mass lesion. No disorder of cortical formation, cortical dysplasia, or heterotopia. Morphologically normal pituitary, corpus callosum, and vermis. Hippocampi are symmetric in size and signal.  Vascular: Normal flow voids.  Skull and upper cervical spine: Normal marrow  signal.  Sinuses/Orbits: Negative.   Assessment and Plan:    1. Flushing   2. Other specified hypotension   3. Bloating   4. Lightheadedness   5. Other fatigue   6. Weight gain     1. Visit with Dr. Dorris Carnes (Cardiology)   2. 24 hour urine for 5-HIAA, Metanephrines, VMA, catecholamine, cortisol, creatinine  3.  Urease breath test for Helicobacter pylori  4. Blood - Lyme panel with reflex, SED, CRP, Tryptase  5. Further evaluation?  Kerly has a very large collection of nonspecific complaints that do not add up to a identifiable syndrome.  I think we need to take each complaint separately in regard to investigation of a specific etiologic agent contributing to these complaints.  Thus, for possible intermittent hemodynamic instability I would like for her to visit with cardiology and we will screen her for a possible flushing disorder.  In addition, she has significant fatigue with a documented normal TSH and we will screen her for a cortisol deficiency with a 24-hour cortisol collection.  She has bloating which is above and beyond her usual celiac disease symptomatology and we will check her for Helicobacter pylori infection.  As well, she is interested in being screened for possible Lyme disease which we will complete with the blood tests noted above.  I will be contacting her with the results of her blood tests once they are available for review.  If we cannot identify any specific etiologic agent contributing to her issues then she may have a stress disorder associated with hyperventilation that may require further evaluation and treatment in the future.  Jiles Prows, MD Allergy / Immunology [place] [facility]

## 2017-12-06 NOTE — Patient Instructions (Addendum)
  1. Visit with Dr. Dorris Carnes (Cardiology)   2. 24 hour urine for 5-HIAA, Metanephrines, VMA, catecholamine, cortisol, creatinine  3.  Urease breath test for Helicobacter pylori  4. Blood - Lyme panel with reflex, SED, CRP, Tryptase  5. Further evaluation?

## 2017-12-07 ENCOUNTER — Telehealth: Payer: Self-pay

## 2017-12-07 ENCOUNTER — Encounter: Payer: Self-pay | Admitting: Allergy and Immunology

## 2017-12-07 LAB — C-REACTIVE PROTEIN: CRP: 4.7 mg/L (ref 0.0–4.9)

## 2017-12-07 LAB — SEDIMENTATION RATE: SED RATE: 16 mm/h (ref 0–32)

## 2017-12-07 LAB — LYME AB/WESTERN BLOT REFLEX: Lyme IgG/IgM Ab: <0.91 {ISR} (ref 0.00–0.90)

## 2017-12-07 LAB — CORTISOL: Cortisol: 9.8 ug/dL

## 2017-12-07 NOTE — Telephone Encounter (Signed)
-----   Message from [place], LPN sent at 01/12/3532  2:53 PM EST ----- Regarding: referral Need referral to Dr. Dorris Carnes, Cardiology for fatigue, dizziness. Thank you.

## 2017-12-07 NOTE — Telephone Encounter (Signed)
Referral placed in [facility] Cardiology with Dr Dorris Carnes.

## 2017-12-08 NOTE — Addendum Note (Signed)
Addended by: Lucrezia Starch I on: 12/08/2017 10:22 AM   Modules accepted: Orders

## 2017-12-09 LAB — CREATININE CLEARANCE, URINE, 24 HOUR
CREAT CLEAR: 108 mL/min (ref 88–128)
Creatinine, 24H Ur: 1146 mg/24 hr (ref 800–1800)
Creatinine, Ser: 0.74 mg/dL (ref 0.57–1.00)
Creatinine, Urine: 38.2 mg/dL
GFR calc Af Amer: 116 mL/min/{1.73_m2} (ref >59)
GFR calc non Af Amer: 100 mL/min/{1.73_m2} (ref >59)

## 2017-12-14 NOTE — Telephone Encounter (Signed)
Pt has been contacted by Dr. Harrington Challenger' office, but declined setting up an appointment at this time due to lack of transportation and awaiting lab results.

## 2017-12-15 LAB — METANEPHRINES, URINE, 24 HOUR
METANEPH TOTAL UR: 52 ug/L
METANEPHRINES 24H UR: 156 ug/(24.h) (ref 45–290)
Normetanephrine, 24H Ur: 351 ug/24 hr (ref 82–500)
Normetanephrine, Ur: 117 ug/L

## 2017-12-15 LAB — CATECHOLAMINE+VMA, 24-HR URINE
VMA, 24H Ur Adult: 5.1 mg/24 hr (ref 0.0–7.5)
VMA, Urine: 1.7 mg/L

## 2017-12-15 LAB — CORTISOL, URINE, FREE
CORTISOL,F,UG/L,U: 13 ug/L
Cortisol (Ur), Free: 39 ug/24 hr (ref 0–50)

## 2017-12-15 LAB — 5 HIAA, QUANTITATIVE, URINE, 24 HOUR
5 HIAA UR: 1.1 mg/L
5-HIAA,QUANT.,24 HR URINE: 3.3 mg/(24.h) (ref 0.0–14.9)

## 2017-12-15 LAB — TRYPTASE: Tryptase: 2.5 ug/L (ref 2.2–13.2)

## 2018-01-02 ENCOUNTER — Encounter: Payer: Self-pay | Admitting: Gastroenterology

## 2018-01-09 ENCOUNTER — Telehealth: Payer: Self-pay | Admitting: *Deleted

## 2018-01-09 NOTE — Telephone Encounter (Signed)
Per message received from Dr. Harrington Challenger- I left message for patient to call back to schedule appointment with our office.  She was seen in [facility] for fatigue, dizziness, headache hypotension.  Asked that pt call back to office and schedule appointment.

## 2018-01-09 NOTE — Telephone Encounter (Signed)
-----   Message ----- From: Rivka Barbara Sent: 12/30/2017   3:02 PM To: Jiles Prows, MD Subject: Referral                                       Called patient 3 times to schedule referral appoint. No success. Closing referral

## 2018-02-13 ENCOUNTER — Encounter

## 2018-02-13 ENCOUNTER — Other Ambulatory Visit (INDEPENDENT_AMBULATORY_CARE_PROVIDER_SITE_OTHER): Payer: BC Managed Care – PPO

## 2018-02-13 ENCOUNTER — Encounter: Payer: Self-pay | Admitting: Gastroenterology

## 2018-02-13 ENCOUNTER — Ambulatory Visit: Payer: BC Managed Care – PPO | Admitting: Gastroenterology

## 2018-02-13 VITALS — BP 118/80 | HR 72 | Ht 61.0 in | Wt 166.5 lb

## 2018-02-13 DIAGNOSIS — K900 Celiac disease: Secondary | ICD-10-CM

## 2018-02-13 DIAGNOSIS — R140 Abdominal distension (gaseous): Secondary | ICD-10-CM | POA: Diagnosis not present

## 2018-02-13 DIAGNOSIS — R635 Abnormal weight gain: Secondary | ICD-10-CM | POA: Diagnosis not present

## 2018-02-13 LAB — IGA: IgA: 511 mg/dL — ABNORMAL HIGH (ref 68–378)

## 2018-02-13 NOTE — Patient Instructions (Addendum)
We will get records sent from your previous gastroenterologist (Dr. Michail Sermon at [facility]) for review.  This will include any endoscopic (colonoscopy or upper endoscopy) procedures and any associated pathology reports.   You will have labs checked today in the basement lab.  Please head down after you check out with the front desk  (tTG, total IgA level). You will be set up for an upper endoscopy for bloating, history of Sprue.  Normal BMI (Body Mass Index- based on height and weight) is between 19 and 25. Your BMI today is Body mass index is 31.46 kg/m. Marland Kitchen Please consider follow up  regarding your BMI with your Primary Care Provider.

## 2018-02-13 NOTE — Progress Notes (Signed)
HPI: This is a very pleasant 42 year old woman who was referred to me by Fanny Bien, MD  to evaluate bloating, weight gain, history of celiac sprue.     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  She actually had weight gain around that time, a lot of bloating, regurgitation.  Her bowels were never loose or soft.  She had a lot of testing and eventually underwent upper endoscopy, Dr. Michail Sermon at [facility].  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 Has seen neurology. Since then low energy level. Has been exhausted, light headed.  Has been gaining weight; eating well.  6 months, up 10 pounds.  Over Easter : vomiting and diarrheal illness, acute, severe for 1-2 days.  She feels she avoids gluten 90% of the time (total).  Bloated a lot.  No nausea or vomiting.  She has had no troubles with her bowels.  Not loose or particularly constipated.  She goes once or twice a day regularly no overt GI bleeding  Chief complaint is bloating, weight gain, history of celiac sprue    Old Data Reviewed: Blood work February 2009 shows normal hemoglobin, MCV is 100, platelets are normal, white blood cell count normal.  Complete metabolic profile was fairly normal except for potassium 7.3.  Vitamin D was normal.  Cortisol level was normal.    Review of systems: Pertinent positive and negative review of systems were noted in the above HPI section. All other review negative.   Past Medical History:  Diagnosis Date  . Celiac disease   . Gluten free diet   . Right ovarian cyst   . Seizures ([place])   . Vision abnormalities   . Vitamin D deficiency   . Wears contact lenses     Past Surgical History:  Procedure Laterality Date  . LAPAROSCOPY Right 08/27/2014   Procedure: LAPAROSCOPY DIAGNOSTIC,RIGHT OOPHORECTOMY;   Surgeon: Marylynn Pearson, MD;  Location: [facility];  Service: Gynecology;  Laterality: Right;  . MOUTH SURGERY      No current outpatient medications on file.   No current facility-administered medications for this visit.     Allergies as of 02/13/2018 - Review Complete 02/13/2018  Allergen Reaction Noted  . Gluten meal  09/06/2017    Family History  Problem Relation Age of Onset  . Healthy Mother   . Diabetes Mellitus II Father   . Heart attack Father   . Healthy Brother   . Breast cancer Maternal Grandmother   . Alzheimer's disease Maternal Grandmother   . Alzheimer's disease Paternal Grandmother   . Liver cancer Paternal Grandfather     Social History   Socioeconomic History  . Marital status: Divorced    Spouse name: Not on file  . Number of children: Not on file  . Years of education: Not on file  . Highest education level: Not on file  Occupational History  . Not on file  Social Needs  . Financial resource strain: Not on file  . Food insecurity:    Worry: Not on file    Inability: Not on file  . Transportation needs:    Medical: Not on file    Non-medical: Not on file  Tobacco Use  . Smoking status: Never Smoker  . Smokeless tobacco: Never Used  Substance and Sexual Activity  . Alcohol use:  Yes    Alcohol/week: 3.0 oz    Types: 5 Glasses of wine per week    Comment: occasional  . Drug use: No  . Sexual activity: Yes  Lifestyle  . Physical activity:    Days per week: Not on file    Minutes per session: Not on file  . Stress: Not on file  Relationships  . Social connections:    Talks on phone: Not on file    Gets together: Not on file    Attends religious service: Not on file    Active member of club or organization: Not on file    Attends meetings of clubs or organizations: Not on file    Relationship status: Not on file  . Intimate partner violence:    Fear of current or ex partner: Not on file    Emotionally abused: Not on  file    Physically abused: Not on file    Forced sexual activity: Not on file  Other Topics Concern  . Not on file  Social History Narrative  . Not on file     Physical Exam: BP 118/80   Pulse 72   Ht 5' 1"  (1.549 m)   Wt 166 lb 8 oz (75.5 kg)   BMI 31.46 kg/m  Constitutional: generally well-appearing Psychiatric: alert and oriented x3 Eyes: extraocular movements intact Mouth: oral pharynx moist, no lesions Neck: supple no lymphadenopathy Cardiovascular: heart regular rate and rhythm Lungs: clear to auscultation bilaterally Abdomen: soft, nontender, nondistended, no obvious ascites, no peritoneal signs, normal bowel sounds Extremities: no lower extremity edema bilaterally Skin: no lesions on visible extremities   Assessment and plan: 42 y.o. female with bloating, weight gain, history of celiac sprue  First, we will get records from her previous gastroenterologist for review.  Second, I think it is worthwhile to restage her celiac sprue.  She is pretty good about avoiding gluten but not perfect from her own admission.  Some people are extremely sensitive to even small levels of gluten.  Perhaps she is like that.  She will get celiac sprue serologies including TTG and total IgA level today.  She will be scheduled for an EGD at her soonest convenience as well.    Please see the "Patient Instructions" section for addition details about the plan.   Owens Loffler, MD [facility] 02/13/2018, 8:35 AM  Cc: Fanny Bien, MD

## 2018-02-14 ENCOUNTER — Encounter: Payer: Self-pay | Admitting: Gastroenterology

## 2018-02-14 ENCOUNTER — Ambulatory Visit (AMBULATORY_SURGERY_CENTER): Payer: BC Managed Care – PPO | Admitting: Gastroenterology

## 2018-02-14 ENCOUNTER — Other Ambulatory Visit: Payer: Self-pay

## 2018-02-14 VITALS — BP 108/65 | HR 67 | Temp 98.9°F | Resp 18 | Ht 61.0 in | Wt 166.0 lb

## 2018-02-14 DIAGNOSIS — K2970 Gastritis, unspecified, without bleeding: Secondary | ICD-10-CM | POA: Diagnosis not present

## 2018-02-14 DIAGNOSIS — K2990 Gastroduodenitis, unspecified, without bleeding: Secondary | ICD-10-CM | POA: Diagnosis not present

## 2018-02-14 DIAGNOSIS — K900 Celiac disease: Secondary | ICD-10-CM | POA: Diagnosis present

## 2018-02-14 DIAGNOSIS — R140 Abdominal distension (gaseous): Secondary | ICD-10-CM | POA: Diagnosis not present

## 2018-02-14 DIAGNOSIS — K3189 Other diseases of stomach and duodenum: Secondary | ICD-10-CM | POA: Diagnosis not present

## 2018-02-14 LAB — TISSUE TRANSGLUTAMINASE, IGA: (TTG) AB, IGA: 7 U/mL — AB

## 2018-02-14 MED ORDER — SODIUM CHLORIDE 0.9 % IV SOLN: 500.0000 mL | Freq: Once | INTRAVENOUS | Status: DC

## 2018-02-14 NOTE — Op Note (Signed)
[facility] Patient Name: Shalimar Mcclain Procedure Date: 02/14/2018 9:59 AM MRN: [id number] Endoscopist: Milus Banister , MD Age: 42 Referring MD:  Date of Birth: 1976-06-10 Gender: Female Account #: [id number] Procedure:                Upper GI endoscopy Indications:              Dyspepsia, history of celiac sprue (Dr. Michail Sermon                            remotely) Medicines:                Monitored Anesthesia Care Procedure:                Pre-Anesthesia Assessment:                           - Prior to the procedure, a History and Physical                            was performed, and patient medications and                            allergies were reviewed. The patient's tolerance of                            previous anesthesia was also reviewed. The risks                            and benefits of the procedure and the sedation                            options and risks were discussed with the patient.                            All questions were answered, and informed consent                            was obtained. Prior Anticoagulants: The patient has                            taken no previous anticoagulant or antiplatelet                            agents. ASA Grade Assessment: II - A patient with                            mild systemic disease. After reviewing the risks                            and benefits, the patient was deemed in                            satisfactory condition to undergo the procedure.  After obtaining informed consent, the endoscope was                            passed under direct vision. Throughout the                            procedure, the patient's blood pressure, pulse, and                            oxygen saturations were monitored continuously. The                            Endoscope was introduced through the mouth, and                            advanced to the second part of duodenum. The upper                             GI endoscopy was accomplished without difficulty.                            The patient tolerated the procedure well. Scope In: Scope Out: Findings:                 A small hiatal hernia was present.                           Mild inflammation characterized by erythema was                            found in the gastric antrum. Biopsies were taken                            with a cold forceps for histology.                           The exam was otherwise without abnormality.                           The duodenum was biopsied. Complications:            No immediate complications. Estimated blood loss:                            None. Estimated Blood Loss:     Estimated blood loss: none. Impression:               - Small hiatal hernia.                           - Mild gastritis, biospied.                           - Normal appearing duodenum was biopsied to check  for Celiac Sprue changes.                           - The examination was otherwise normal. Recommendation:           - Patient has a contact number available for                            emergencies. The signs and symptoms of potential                            delayed complications were discussed with the                            patient. Return to normal activities tomorrow.                            Written discharge instructions were provided to the                            patient.                           - Resume previous diet.                           - Continue present medications.                           - Await pathology results. Milus Banister, MD 02/14/2018 10:12:09 AM This report has been signed electronically.

## 2018-02-14 NOTE — Progress Notes (Signed)
Pt's states no medical or surgical changes since previsit or office visit. 

## 2018-02-14 NOTE — Patient Instructions (Signed)
Discharge instructions given. Handouts on a hiatal hernia and Gastritis. Resume previous medications. YOU HAD AN ENDOSCOPIC PROCEDURE TODAY AT THE [facility]:   Refer to the procedure report that was given to you for any specific questions about what was found during the examination.  If the procedure report does not answer your questions, please call your gastroenterologist to clarify.  If you requested that your care partner not be given the details of your procedure findings, then the procedure report has been included in a sealed envelope for you to review at your convenience later.  YOU SHOULD EXPECT: Some feelings of bloating in the abdomen. Passage of more gas than usual.  Walking can help get rid of the air that was put into your GI tract during the procedure and reduce the bloating. If you had a lower endoscopy (such as a colonoscopy or flexible sigmoidoscopy) you may notice spotting of blood in your stool or on the toilet paper. If you underwent a bowel prep for your procedure, you may not have a normal bowel movement for a few days.  Please Note:  You might notice some irritation and congestion in your nose or some drainage.  This is from the oxygen used during your procedure.  There is no need for concern and it should clear up in a day or so.  SYMPTOMS TO REPORT IMMEDIATELY:    Following upper endoscopy (EGD)  Vomiting of blood or coffee ground material  New chest pain or pain under the shoulder blades  Painful or persistently difficult swallowing  New shortness of breath  Fever of 100F or higher  Black, tarry-looking stools  For urgent or emergent issues, a gastroenterologist can be reached at any hour by calling [phone].   DIET:  We do recommend a small meal at first, but then you may proceed to your regular diet.  Drink plenty of fluids but you should avoid alcoholic beverages for 24 hours.  ACTIVITY:  You should plan to take it easy for the rest of  today and you should NOT DRIVE or use heavy machinery until tomorrow (because of the sedation medicines used during the test).    FOLLOW UP: Our staff will call the number listed on your records the next business day following your procedure to check on you and address any questions or concerns that you may have regarding the information given to you following your procedure. If we do not reach you, we will leave a message.  However, if you are feeling well and you are not experiencing any problems, there is no need to return our call.  We will assume that you have returned to your regular daily activities without incident.  If any biopsies were taken you will be contacted by phone or by letter within the next 1-3 weeks.  Please call us at [phone] if you have not heard about the biopsies in 3 weeks.    SIGNATURES/CONFIDENTIALITY: You and/or your care partner have signed paperwork which will be entered into your electronic medical record.  These signatures attest to the fact that that the information above on your After Visit Summary has been reviewed and is understood.  Full responsibility of the confidentiality of this discharge information lies with you and/or your care-partner. 

## 2018-02-14 NOTE — Progress Notes (Signed)
Called to room to assist during endoscopic procedure.  Patient ID and intended procedure confirmed with present staff. Received instructions for my participation in the procedure from the performing physician.  

## 2018-02-14 NOTE — Progress Notes (Signed)
To PACU, VSS. Report to [email or web address] 

## 2018-02-15 ENCOUNTER — Telehealth: Payer: Self-pay | Admitting: *Deleted

## 2018-02-15 NOTE — Telephone Encounter (Signed)
  Follow up Call-  Call back number 02/14/2018  Post procedure Call Back phone  # [phone]  Permission to leave phone message Yes  Some recent data might be hidden     Patient questions:  Do you have a fever, pain , or abdominal swelling? No. Pain Score  0 *  Have you tolerated food without any problems? Yes.    Have you been able to return to your normal activities? Yes.    Do you have any questions about your discharge instructions: Diet   No. Medications  No. Follow up visit  No.  Do you have questions or concerns about your Care? No.  Actions: * If pain score is 4 or above: No action needed, pain <4.

## 2018-02-20 ENCOUNTER — Telehealth: Payer: Self-pay | Admitting: Gastroenterology

## 2018-02-20 NOTE — Telephone Encounter (Signed)
Upper endoscopy, Dr. Wilford Corner from [facility], August 2011.  Indications "heartburn, persistent vomiting".  Findings irregular Z line, biopsied.  Congestion in the gastric antrum, biopsied, patchy mild congestion and flattening of the duodenal bulb and second part of the duodenum.  Biopsied.  Pathology shows GE junction was mildly inflamed with reflux related injury.  Stomach showed mildly reactive gastric antral mucosa negative for H. pylori.  Duodenum biopsies "variable villous blunting, lymphoplasmacytic lamina propria expansion and patchy increased intraepithelial lymphocytes".    Can you please reach out to [facility] again?  I was hoping to also have her celiac sprue serology tests sent here for review.This would be probably in the year 2011, around the same time as her EGD

## 2018-02-20 NOTE — Telephone Encounter (Signed)
I spoke with [facility] and the lab is being faxed to our office for review.  I will put on Dr Ardis Hughs desk for review.

## 2018-02-27 ENCOUNTER — Ambulatory Visit: Payer: BC Managed Care – PPO | Admitting: Neurology

## 2018-02-28 NOTE — Telephone Encounter (Signed)
Further records were received from [facility].    TTG August 2011 was 24.   TTG December 2011 was 5

## 2018-03-22 ENCOUNTER — Other Ambulatory Visit: Payer: Self-pay

## 2018-03-22 ENCOUNTER — Ambulatory Visit: Payer: BC Managed Care – PPO | Admitting: Neurology

## 2018-03-22 ENCOUNTER — Encounter: Payer: Self-pay | Admitting: Neurology

## 2018-03-22 VITALS — BP 135/89 | HR 105 | Resp 18 | Ht 61.0 in | Wt 160.0 lb

## 2018-03-22 DIAGNOSIS — K900 Celiac disease: Secondary | ICD-10-CM | POA: Diagnosis not present

## 2018-03-22 DIAGNOSIS — R569 Unspecified convulsions: Secondary | ICD-10-CM | POA: Insufficient documentation

## 2018-03-22 DIAGNOSIS — R5383 Other fatigue: Secondary | ICD-10-CM | POA: Insufficient documentation

## 2018-03-22 NOTE — Progress Notes (Signed)
[facility]  PATIENT: Jordan Snyder DOB: August 15, 1976  REFERRING DOCTOR OR PCP:  Dr. Julien Girt SOURCE: patient, notes from PCP and ED, labs and imaging reports, T8 MRI images on PACS.  _________________________________   HISTORICAL  CHIEF COMPLAINT:  Chief Complaint  Patient presents with  . Seizures    Denies sz. since last ov.  Sts. since last ov, she saw a sz. specialist at [facility], was dx. with postconcussive syndrome, which she believes accounted for the trouble with word finding that she had. Did not have sleep study that was ordered at last ov--sts. all sx. have resolved, so she doesn't    HISTORY OF PRESENT ILLNESS:   Update 03/22/2018: Since her last visit, she denies any other seizure or spell.     She saw an epileptologist at [facility] who concurred  that there was not an indication to begin an anti-epileptic drug with a single unprovoked seizure.    He felt her continued malaise could possibly be due to hitting her head during the seizure.     She feels some of her other symptoms including lightheadedness and fatigue resolved around April.    She has increased here activity.   Also of note, her celiac disease flared up earlier this year.    She never did the Home Sleep Test to determine if there was any OSA.  She is sleeping well.    Her fiance has not noted snoring recently (did last year).   She has lost a few pounds.       From 09/29/2017: I had the pleasure seeing you patient, Malisa Ruggiero, at [facility] for neurologic consultation regarding her single seizure on 09/06/2017. She is generally in good health with celiac disease being her only issue. She will she is not on any medications and does not use illicit drugs. She was in her usual state of good health and had a normal lunch with her coworkers. Shortly thereafter in the office a call for her further strange sound and came in and saw her lying behind her desk with convulsions in the upper  body and frothy saliva at the mouth. The lips were slightly blue. She was rolled on her side and continued to have some upper body convulsive movements for a few more minutes she began to improve before EMS arrived and did better after oxygen mask was placed.      She has no recollection until she was in the ambulance.  She was confused and communicative and was back to her baseline about 30-60 minutes later.     She had additional labwork a few days a  She snores more since the seizure and feels more fatigued.   She does not wake up with a headache though may have some later in the day.    She has gained 12-15 pounds.     EPWORTH SLEEPINESS SCALE  On a scale of 0 - 3 what is the chance of dozing:  Sitting and Reading:   3 Watching TV:    3 Sitting inactive in a public place: 0 Passenger in car for one hour: 3 Lying down to rest in the afternoon: 3 Sitting and talking to someone: 0 Sitting quietly after lunch:  0 In a car, stopped in traffic:  0  Total (out of 24):   12/24  (mild EDS)   She presented to the emergency department 09/06/2017. I reviewed the notes, labs and imaging reports. Labs were normal except for an elevated  prolactin consistent with a recent seizure. CT scan and MRI was essentially normal. I personally reviewed the images and concur.  I also reviewed the EEG performed 09/07/2017. It is normal.  There is no family history of seizures. She had a normal birth and early childhood. There is no history of meningitis or encephalitis. She never had any significant head trauma.   She did have a concussion at age 48 without loss of consciousness.  REVIEW OF SYSTEMS: Constitutional: No fevers, chills, sweats, or change in appetite Eyes: No visual changes, double vision, eye pain Ear, nose and throat: No hearing loss, ear pain, nasal congestion, sore throat Cardiovascular: No chest pain, palpitations Respiratory: No shortness of breath at rest or with exertion.   No  wheezes GastrointestinaI: No nausea, vomiting, diarrhea, abdominal pain, fecal incontinence Genitourinary: No dysuria, urinary retention or frequency.  No nocturia. Musculoskeletal: No neck pain, back pain Integumentary: No rash, pruritus, skin lesions Neurological: as above Psychiatric: No depression at this time.  No anxiety Endocrine: No palpitations, diaphoresis, change in appetite, change in weigh or increased thirst Hematologic/Lymphatic: No anemia, purpura, petechiae. Allergic/Immunologic: No itchy/runny eyes, nasal congestion, recent allergic reactions, rashes  ALLERGIES: Allergies  Allergen Reactions  . Gluten Meal     HOME MEDICATIONS: No current outpatient medications on file.  Current Facility-Administered Medications:  .  0.9 %  sodium chloride infusion, 500 mL, Intravenous, Once, Milus Banister, MD  PAST MEDICAL HISTORY: Past Medical History:  Diagnosis Date  . Celiac disease   . Gluten free diet   . Right ovarian cyst   . Seizures ([place])   . Vision abnormalities   . Vitamin D deficiency   . Wears contact lenses     PAST SURGICAL HISTORY: Past Surgical History:  Procedure Laterality Date  . LAPAROSCOPY Right 08/27/2014   Procedure: LAPAROSCOPY DIAGNOSTIC,RIGHT OOPHORECTOMY;  Surgeon: Marylynn Pearson, MD;  Location: [facility];  Service: Gynecology;  Laterality: Right;  . MOUTH SURGERY      FAMILY HISTORY: Family History  Problem Relation Age of Onset  . Healthy Mother   . Diabetes Mellitus II Father   . Heart attack Father   . Healthy Brother   . Breast cancer Maternal Grandmother   . Alzheimer's disease Maternal Grandmother   . Alzheimer's disease Paternal Grandmother   . Liver cancer Paternal Grandfather     SOCIAL HISTORY:  Social History   Socioeconomic History  . Marital status: Divorced    Spouse name: Not on file  . Number of children: Not on file  . Years of education: Not on file  . Highest education level:  Not on file  Occupational History  . Not on file  Social Needs  . Financial resource strain: Not on file  . Food insecurity:    Worry: Not on file    Inability: Not on file  . Transportation needs:    Medical: Not on file    Non-medical: Not on file  Tobacco Use  . Smoking status: Never Smoker  . Smokeless tobacco: Never Used  Substance and Sexual Activity  . Alcohol use: Yes    Alcohol/week: 3.0 oz    Types: 5 Glasses of wine per week    Comment: occasional  . Drug use: No  . Sexual activity: Yes  Lifestyle  . Physical activity:    Days per week: Not on file    Minutes per session: Not on file  . Stress: Not on file  Relationships  . Social  connections:    Talks on phone: Not on file    Gets together: Not on file    Attends religious service: Not on file    Active member of club or organization: Not on file    Attends meetings of clubs or organizations: Not on file    Relationship status: Not on file  . Intimate partner violence:    Fear of current or ex partner: Not on file    Emotionally abused: Not on file    Physically abused: Not on file    Forced sexual activity: Not on file  Other Topics Concern  . Not on file  Social History Narrative  . Not on file     PHYSICAL EXAM  Vitals:   03/22/18 1418  BP: 135/89  Pulse: (!) 105  Resp: 18  Weight: 160 lb (72.6 kg)  Height: 5' 1"  (1.549 m)    Body mass index is 30.23 kg/m.   General: The patient is well-developed and well-nourished and in no acute distress   Neurologic Exam  Mental status: The patient is alert and oriented x 3 at the time of the examination. The patient has apparent normal recent and remote memory, with an apparently normal attention span and concentration ability.   Speech is normal.  Cranial nerves: Extraocular movements are full.  Facial strength and sensation is normal.  Trapezius strength is normal.  The tongue is midline, and the patient has symmetric elevation of the soft  palate. No obvious hearing deficits are noted.  Motor:  Muscle bulk is normal.   Tone is normal. Strength is  5 / 5 in all 4 extremities.   Sensory: Sensory testing is intact to touch  Gait and station: Station is normal.   Gait and tandem gait are normal.  Romberg is negative.  Reflexes: Deep tendon reflexes are symmetric and normal bilaterally.   Plantar responses are flexor.    DIAGNOSTIC DATA (LABS, IMAGING, TESTING) - I reviewed patient records, labs, notes, testing and imaging myself where available.  Lab Results  Component Value Date   WBC 9.0 09/06/2017   HGB 13.7 09/06/2017   HCT 42.2 09/06/2017   MCV 96.6 09/06/2017   PLT 279 09/06/2017      Component Value Date/Time   NA 136 09/06/2017 1609   K 3.6 09/06/2017 1609   CL 106 09/06/2017 1609   CO2 22 09/06/2017 1609   GLUCOSE 115 (H) 09/06/2017 1609   BUN 9 09/06/2017 1609   CREATININE 0.74 12/08/2017 0929   CALCIUM 8.9 09/06/2017 1609   PROT 7.3 09/06/2017 1615   ALBUMIN 3.7 09/06/2017 1615   AST 29 09/06/2017 1615   ALT 17 09/06/2017 1615   ALKPHOS 53 09/06/2017 1615   BILITOT 0.5 09/06/2017 1615   GFRNONAA 100 12/08/2017 0929   GFRAA 116 12/08/2017 0929    Lab Results  Component Value Date   TSH 4.057 09/06/2017       ASSESSMENT AND PLAN  Celiac disease  Single unprovoked seizure ([place])  Other fatigue   1.   She has a single unprovoked seizure with no other risk factor.  She will remain off of an antiepileptic drug.  We discussed that if she has a second seizure her risk of further seizures will be higher and we would need to reconside treatment. 2.   For fatigue and sleepiness is bette she reports that she is not snoring anymore.  Therefore, we can hold off onr further evaluation with a sleep study. 3.  No follow-up will be scheduled at this time.  However, she is advised to call me or her neurologist at [facility] if she has another seizure or other symptoms.  Kareen Hitsman A. Felecia Shelling, MD, [facility]  10/27/3565, 0:14 PM Certified in Neurology, Clinical Neurophysiology, Sleep Medicine, Pain Medicine and Neuroimaging  [facility] [address] [place], [place] [address] [phone]

## 2018-04-21 ENCOUNTER — Ambulatory Visit: Payer: BC Managed Care – PPO | Admitting: Gastroenterology

## 2018-05-08 ENCOUNTER — Ambulatory Visit: Payer: BC Managed Care – PPO | Admitting: Internal Medicine

## 2018-05-10 ENCOUNTER — Encounter

## 2018-06-01 ENCOUNTER — Emergency Department (HOSPITAL_COMMUNITY)
Admission: EM | Admit: 2018-06-01 | Discharge: 2018-06-01 | Disposition: A | Discharge status: Home / Self Care | Payer: BC Managed Care – PPO | Attending: Emergency Medicine | Admitting: Emergency Medicine

## 2018-06-01 ENCOUNTER — Emergency Department (HOSPITAL_COMMUNITY): Payer: BC Managed Care – PPO

## 2018-06-01 DIAGNOSIS — R569 Unspecified convulsions: Secondary | ICD-10-CM | POA: Insufficient documentation

## 2018-06-01 DIAGNOSIS — Z79899 Other long term (current) drug therapy: Secondary | ICD-10-CM | POA: Diagnosis not present

## 2018-06-01 LAB — CBC WITH DIFFERENTIAL/PLATELET
BASOS ABS: 0 10*3/uL (ref 0.0–0.1)
Basophils Relative: 0 %
EOS PCT: 1 %
Eosinophils Absolute: 0.1 10*3/uL (ref 0.0–0.7)
HEMATOCRIT: 41.6 % (ref 36.0–46.0)
Hemoglobin: 13.6 g/dL (ref 12.0–15.0)
LYMPHS ABS: 1.6 10*3/uL (ref 0.7–4.0)
LYMPHS PCT: 16 %
MCH: 31.6 pg (ref 26.0–34.0)
MCHC: 32.7 g/dL (ref 30.0–36.0)
MCV: 96.7 fL (ref 78.0–100.0)
Monocytes Absolute: 0.8 10*3/uL (ref 0.1–1.0)
Monocytes Relative: 8 %
Neutro Abs: 7.5 10*3/uL (ref 1.7–7.7)
Neutrophils Relative %: 75 %
PLATELETS: 347 10*3/uL (ref 150–400)
RBC: 4.3 MIL/uL (ref 3.87–5.11)
RDW: 13.3 % (ref 11.5–15.5)
WBC: 10 10*3/uL (ref 4.0–10.5)

## 2018-06-01 LAB — CBG MONITORING, ED: Glucose-Capillary: 97 mg/dL (ref 70–99)

## 2018-06-01 LAB — I-STAT CHEM 8, ED
BUN: 9 mg/dL (ref 6–20)
Calcium, Ion: 1.05 mmol/L — ABNORMAL LOW (ref 1.15–1.40)
Chloride: 105 mmol/L (ref 98–111)
Creatinine, Ser: 0.5 mg/dL (ref 0.44–1.00)
Glucose, Bld: 83 mg/dL (ref 70–99)
HEMATOCRIT: 41 % (ref 36.0–46.0)
HEMOGLOBIN: 13.9 g/dL (ref 12.0–15.0)
POTASSIUM: 4.2 mmol/L (ref 3.5–5.1)
SODIUM: 139 mmol/L (ref 135–145)
TCO2: 24 mmol/L (ref 22–32)

## 2018-06-01 LAB — I-STAT TROPONIN, ED: TROPONIN I, POC: 0.01 ng/mL (ref 0.00–0.08)

## 2018-06-01 LAB — I-STAT BETA HCG BLOOD, ED (MC, WL, AP ONLY): I-stat hCG, quantitative: <5 m[IU]/mL (ref <5)

## 2018-06-01 MED ORDER — IBUPROFEN 800 MG PO TABS
800.0000 mg | ORAL_TABLET | Freq: Once | ORAL | Status: AC
  Administered 2018-06-01: 800 mg via ORAL
  Filled 2018-06-01: qty 1

## 2018-06-01 MED ORDER — SODIUM CHLORIDE 0.9 % IV BOLUS
1000.0000 mL | Freq: Once | INTRAVENOUS | Status: AC
  Administered 2018-06-01: 1000 mL via INTRAVENOUS

## 2018-06-01 NOTE — ED Notes (Signed)
Bed: UN99 Expected date:  Expected time:  Means of arrival:  Comments: Syncopal fall

## 2018-06-01 NOTE — ED Provider Notes (Signed)
[facility] DEPT Provider Note   CSN: [id number] Arrival date & time: 06/01/18  2006     History   Chief Complaint Chief Complaint  Patient presents with  . Seizures    HPI Jordan Snyder is a 42 y.o. female.  Patient was not taken a shower and her husband heard a large sound.  The patient had fallen in the bathtub and she was shaking like a seizure.  Patient had what may have been a seizure before and was evaluated by neurology.  Patient had been fasting since 8 AM this morning.  The history is provided by the patient and a relative. No language interpreter was used.  Seizures   This is a new problem. The current episode started less than 1 hour ago. The problem has been resolved. There was 1 seizure. The most recent episode lasted 2 to 5 minutes. Associated symptoms include confusion. Pertinent negatives include no sleepiness. Characteristics do not include bowel incontinence. The episode was witnessed. There was no sensation of an aura present. The seizures did not continue in the ED. The seizure(s) had no focality. Possible causes do not include medication or dosage change. There has been no fever. There were no medications administered prior to arrival.    Past Medical History:  Diagnosis Date  . Celiac disease   . Gluten free diet   . Right ovarian cyst   . Seizures ([place])   . Vision abnormalities   . Vitamin D deficiency   . Wears contact lenses     Patient Active Problem List   Diagnosis Date Noted  . Single unprovoked seizure ([place]) 03/22/2018  . Other fatigue 03/22/2018  . Seizures ([place]) 09/29/2017  . Snoring 09/29/2017  . Excessive daytime sleepiness 09/29/2017  . Celiac disease     Past Surgical History:  Procedure Laterality Date  . LAPAROSCOPY Right 08/27/2014   Procedure: LAPAROSCOPY DIAGNOSTIC,RIGHT OOPHORECTOMY;  Surgeon: Marylynn Pearson, MD;  Location: [facility];  Service: Gynecology;  Laterality: Right;  .  MOUTH SURGERY       OB History   None      Home Medications    Prior to Admission medications   Medication Sig Start Date End Date Taking? Authorizing Provider  ibuprofen (ADVIL,MOTRIN) 800 MG tablet Take 800 mg by mouth every 8 (eight) hours as needed for pain. 08/27/14  Yes [provider]  phentermine (ADIPEX-P) 37.5 MG tablet Take 37.5 mg by mouth daily. 04/19/18  Yes [provider]    Family History Family History  Problem Relation Age of Onset  . Healthy Mother   . Diabetes Mellitus II Father   . Heart attack Father   . Healthy Brother   . Breast cancer Maternal Grandmother   . Alzheimer's disease Maternal Grandmother   . Alzheimer's disease Paternal Grandmother   . Liver cancer Paternal Grandfather     Social History Social History   Tobacco Use  . Smoking status: Never Smoker  . Smokeless tobacco: Never Used  Substance Use Topics  . Alcohol use: Yes    Alcohol/week: 5.0 standard drinks    Types: 5 Glasses of wine per week    Comment: occasional  . Drug use: No     Allergies   Gluten meal   Review of Systems Review of Systems  Unable to perform ROS: Mental status change  Gastrointestinal: Negative for bowel incontinence.  Neurological: Positive for seizures.  Psychiatric/Behavioral: Positive for confusion.     Physical Exam Updated  Vital Signs BP 133/88   Pulse 81   Temp (!) 97.4 F (36.3 C) (Oral)   Resp 17   SpO2 100%   Physical Exam  Constitutional: She is oriented to person, place, and time. She appears well-developed.  HENT:  Head: Normocephalic.  Small laceration to tongue  Eyes: Conjunctivae and EOM are normal. No scleral icterus.  Neck: Neck supple. No thyromegaly present.  Cardiovascular: Normal rate and regular rhythm. Exam reveals no gallop and no friction rub.  No murmur heard. Pulmonary/Chest: No stridor. She has no wheezes. She has no rales. She exhibits no tenderness.  Abdominal: She exhibits no  distension. There is no tenderness. There is no rebound.  Musculoskeletal: Normal range of motion. She exhibits no edema.  Lymphadenopathy:    She has no cervical adenopathy.  Neurological: She is oriented to person, place, and time. She exhibits normal muscle tone. Coordination normal.  Skin: No rash noted. No erythema.  Psychiatric: She has a normal mood and affect. Her behavior is normal.     ED Treatments / Results  Labs (all labs ordered are listed, but only abnormal results are displayed) Labs Reviewed  I-STAT CHEM 8, ED - Abnormal; Notable for the following components:      Result Value   Calcium, Ion 1.05 (*)    All other components within normal limits  CBC WITH DIFFERENTIAL/PLATELET  CBG MONITORING, ED  I-STAT BETA HCG BLOOD, ED ([facility], [facility], [facility] ONLY)  I-STAT TROPONIN, ED    EKG EKG Interpretation  Date/Time:  Thursday June 01 2018 20:18:23 EDT Ventricular Rate:  86 PR Interval:    QRS Duration: 91 QT Interval:  394 QTC Calculation: 472 R Axis:   72 Text Interpretation:  Age not entered, assumed to be  42 years old for purpose of ECG interpretation Sinus rhythm Borderline short PR interval Confirmed by Milton Ferguson [phone]) on 06/01/2018 11:03:51 PM Also confirmed by Milton Ferguson [phone])  on 06/01/2018 11:04:43 PM   Radiology Ct Head Wo Contrast  Result Date: 06/01/2018 CLINICAL DATA:  42 y/o  F; syncope and fall.  History of seizures. EXAM: CT HEAD WITHOUT CONTRAST TECHNIQUE: Contiguous axial images were obtained from the base of the skull through the vertex without intravenous contrast. COMPARISON:  09/06/2017 MRI head and CT head. FINDINGS: Brain: No evidence of acute infarction, hemorrhage, hydrocephalus, extra-axial collection or mass lesion/mass effect. Vascular: No hyperdense vessel or unexpected calcification. Skull: Normal. Negative for fracture or focal lesion. Sinuses/Orbits: No acute finding. Other: None. IMPRESSION: Stable normal CT of the head.  Electronically Signed   By: Kristine Garbe M.D.   On: 06/01/2018 22:49    Procedures Procedures (including critical care time)  Medications Ordered in ED Medications  sodium chloride 0.9 % bolus 1,000 mL (0 mLs Intravenous Stopped 06/01/18 2208)     Initial Impression / Assessment and Plan / ED Course  I have reviewed the triage vital signs and the nursing notes.  Pertinent labs & imaging results that were available during my care of the patient were reviewed by me and considered in my medical decision making (see chart for details).     Labs including CBC chemistries troponin EKG CT of the head all unremarkable.  Patient may have had a seizure.  It could have been related to a hypoglycemic episode.  Patient is sent home with seizure precautions and will follow-up with her PCP or neurologist next week  Final Clinical Impressions(s) / ED Diagnoses   Final diagnoses:  Seizure ([place])  ED Discharge Orders    None       Milton Ferguson, MD 06/01/18 2317

## 2018-06-01 NOTE — Discharge Instructions (Addendum)
Follow-up with your family doctor or neurologist next week.  Do not drive.

## 2018-06-01 NOTE — ED Triage Notes (Signed)
PT BIB [facility] from home. Pt suffered a syncopal fall, pt has hx of seizures and was postictal when EMS arrived. Pt was CAOx4 in 15 min and is currently. Not c/o anything, SCCA negative. She has a sezure 10 months ago, saw neuro and was never diagnosed with seizures is not meds. Hx celiacs.

## 2018-06-02 ENCOUNTER — Telehealth: Payer: Self-pay | Admitting: Neurology

## 2018-06-02 NOTE — Telephone Encounter (Signed)
Pt has called to request an appointment to be seen because of a [place] she had last night(06-01-2018) Pt has requested to be seen by another provider, pt does not feel she has a connection with Dr Felecia Shelling.

## 2018-06-02 NOTE — Telephone Encounter (Signed)
Dr. Felecia Shelling is okay for this patient to change providers.

## 2018-06-05 ENCOUNTER — Telehealth: Payer: Self-pay | Admitting: Neurology

## 2018-06-05 NOTE — Telephone Encounter (Signed)
Pt Jordan Snyder is requesting to switch from Dr. Felecia Shelling to another provider to treat her for her seizures. Would you be willing to see the pt?

## 2018-06-06 NOTE — Telephone Encounter (Signed)
It's ok with me.

## 2018-06-14 NOTE — Telephone Encounter (Signed)
Ok to switch. -VRP

## 2019-07-12 ENCOUNTER — Other Ambulatory Visit: Payer: Self-pay | Admitting: Obstetrics and Gynecology

## 2019-07-12 DIAGNOSIS — R928 Other abnormal and inconclusive findings on diagnostic imaging of breast: Secondary | ICD-10-CM

## 2019-07-13 ENCOUNTER — Ambulatory Visit
Admission: RE | Admit: 2019-07-13 | Discharge: 2019-07-13 | Disposition: A | Discharge status: Home / Self Care | Payer: BC Managed Care – PPO | Source: Ambulatory Visit | Attending: Obstetrics and Gynecology | Admitting: Obstetrics and Gynecology

## 2019-07-13 ENCOUNTER — Other Ambulatory Visit: Payer: Self-pay | Admitting: Obstetrics and Gynecology

## 2019-07-13 ENCOUNTER — Other Ambulatory Visit: Payer: Self-pay

## 2019-07-13 DIAGNOSIS — N6489 Other specified disorders of breast: Secondary | ICD-10-CM

## 2019-07-13 DIAGNOSIS — R928 Other abnormal and inconclusive findings on diagnostic imaging of breast: Secondary | ICD-10-CM

## 2019-07-24 ENCOUNTER — Other Ambulatory Visit: Payer: Self-pay

## 2019-07-24 DIAGNOSIS — Z20822 Contact with and (suspected) exposure to covid-19: Secondary | ICD-10-CM

## 2019-07-26 LAB — NOVEL CORONAVIRUS, NAA: SARS-CoV-2, NAA: NOT DETECTED

## 2020-01-14 ENCOUNTER — Other Ambulatory Visit: Payer: Self-pay

## 2020-01-14 ENCOUNTER — Ambulatory Visit
Admission: RE | Admit: 2020-01-14 | Discharge: 2020-01-14 | Disposition: A | Discharge status: Home / Self Care | Payer: BC Managed Care – PPO | Source: Ambulatory Visit | Attending: Obstetrics and Gynecology | Admitting: Obstetrics and Gynecology

## 2020-01-14 DIAGNOSIS — N6489 Other specified disorders of breast: Secondary | ICD-10-CM

## 2020-08-20 DIAGNOSIS — Z1231 Encounter for screening mammogram for malignant neoplasm of breast: Secondary | ICD-10-CM

## 2020-08-26 ENCOUNTER — Other Ambulatory Visit: Payer: Self-pay | Admitting: Radiology

## 2020-08-26 DIAGNOSIS — N6489 Other specified disorders of breast: Secondary | ICD-10-CM

## 2020-08-27 ENCOUNTER — Telehealth: Payer: Self-pay

## 2020-08-27 NOTE — Telephone Encounter (Signed)
NOTES ON FILE FROMJAMI CHRZANOWSKI NP [phone], [place]

## 2020-09-22 NOTE — Progress Notes (Deleted)
Cardiology Office Note:    Date:  09/22/2020   ID:  Jordan Snyder, DOB Sep 20, 1976, MRN [id number]  PCP:  Fanny Bien, MD  [place] Cardiologist:  No primary care provider on file.  [facility] Electrophysiologist:  None   Referring MD: Kerry Dory, NP    History of Present Illness:    Jordan Snyder is a 44 y.o. female with a hx of celiac disease and seizures who was referred to clinic by [name] for further evaluation of cardiac risk factors.   Past Medical History:  Diagnosis Date  . Celiac disease   . Gluten free diet   . Right ovarian cyst   . Seizures ([place])   . Vision abnormalities   . Vitamin D deficiency   . Wears contact lenses     Past Surgical History:  Procedure Laterality Date  . LAPAROSCOPY Right 08/27/2014   Procedure: LAPAROSCOPY DIAGNOSTIC,RIGHT OOPHORECTOMY;  Surgeon: Marylynn Pearson, MD;  Location: [facility];  Service: Gynecology;  Laterality: Right;  . MOUTH SURGERY      Current Medications: No outpatient medications have been marked as taking for the 09/25/20 encounter (Appointment) with Freada Bergeron, MD.   Current Facility-Administered Medications for the 09/25/20 encounter (Appointment) with Freada Bergeron, MD  Medication  . 0.9 %  sodium chloride infusion     Allergies:   Gluten meal   Social History   Socioeconomic History  . Marital status: Divorced    Spouse name: Not on file  . Number of children: Not on file  . Years of education: Not on file  . Highest education level: Not on file  Occupational History  . Not on file  Tobacco Use  . Smoking status: Never Smoker  . Smokeless tobacco: Never Used  Vaping Use  . Vaping Use: Never used  Substance and Sexual Activity  . Alcohol use: Yes    Alcohol/week: 5.0 standard drinks    Types: 5 Glasses of wine per week    Comment: occasional  . Drug use: No  . Sexual activity: Yes  Other Topics Concern  . Not on file  Social  History Narrative  . Not on file   Social Determinants of Health   Financial Resource Strain: Not on file  Food Insecurity: Not on file  Transportation Needs: Not on file  Physical Activity: Not on file  Stress: Not on file  Social Connections: Not on file     Family History: The patient's ***family history includes Alzheimer's disease in her maternal grandmother and paternal grandmother; Breast cancer in her maternal grandmother; Diabetes Mellitus II in her father; Healthy in her brother and mother; Heart attack in her father; Liver cancer in her paternal grandfather.  ROS:   Please see the history of present illness.    *** All other systems reviewed and are negative.  EKGs/Labs/Other Studies Reviewed:    The following studies were reviewed today: ***  EKG:  EKG is *** ordered today.  The ekg ordered today demonstrates ***  Recent Labs: No results found for requested labs within last 8760 hours.  Recent Lipid Panel No results found for: CHOL, TRIG, HDL, CHOLHDL, VLDL, LDLCALC, LDLDIRECT   Risk Assessment/Calculations:   {Does this patient have ATRIAL FIBRILLATION?:[id number]}   Physical Exam:    VS:  There were no vitals taken for this visit.    Wt Readings from Last 3 Encounters:  03/22/18 160 lb (72.6 kg)  02/14/18 166 lb (75.3 kg)  02/13/18  166 lb 8 oz (75.5 kg)     GEN: *** Well nourished, well developed in no acute distress HEENT: Normal NECK: No JVD; No carotid bruits LYMPHATICS: No lymphadenopathy CARDIAC: ***RRR, no murmurs, rubs, gallops RESPIRATORY:  Clear to auscultation without rales, wheezing or rhonchi  ABDOMEN: Soft, non-tender, non-distended MUSCULOSKELETAL:  No edema; No deformity  SKIN: Warm and dry NEUROLOGIC:  Alert and oriented x 3 PSYCHIATRIC:  Normal affect   ASSESSMENT:    No diagnosis found. PLAN:    In order of problems listed above:  1. ***   {Are you ordering a CV Procedure (e.g. stress test, cath, DCCV, TEE, etc)?    Press F2        :[id number]}    Medication Adjustments/Labs and Tests Ordered: Current medicines are reviewed at length with the patient today.  Concerns regarding medicines are outlined above.  No orders of the defined types were placed in this encounter.  No orders of the defined types were placed in this encounter.   There are no Patient Instructions on file for this visit.   Signed, Freada Bergeron, MD  09/22/2020 1:30 PM    [place]

## 2020-09-25 ENCOUNTER — Ambulatory Visit: Payer: BC Managed Care – PPO | Admitting: Cardiology

## 2020-09-26 ENCOUNTER — Ambulatory Visit
Admission: RE | Admit: 2020-09-26 | Discharge: 2020-09-26 | Disposition: A | Discharge status: Home / Self Care | Payer: BC Managed Care – PPO | Source: Ambulatory Visit | Attending: Radiology | Admitting: Radiology

## 2020-09-26 ENCOUNTER — Other Ambulatory Visit: Payer: Self-pay

## 2020-09-26 ENCOUNTER — Ambulatory Visit: Payer: BC Managed Care – PPO

## 2020-09-26 DIAGNOSIS — N6489 Other specified disorders of breast: Secondary | ICD-10-CM

## 2020-10-15 ENCOUNTER — Ambulatory Visit: Payer: BC Managed Care – PPO | Admitting: Internal Medicine

## 2020-11-11 ENCOUNTER — Ambulatory Visit: Payer: BC Managed Care – PPO | Admitting: Internal Medicine

## 2020-11-11 ENCOUNTER — Other Ambulatory Visit: Payer: Self-pay

## 2020-11-11 ENCOUNTER — Encounter: Payer: Self-pay | Admitting: Internal Medicine

## 2020-11-11 VITALS — BP 118/82 | HR 79 | Ht 62.0 in | Wt 155.2 lb

## 2020-11-11 DIAGNOSIS — Z8249 Family history of ischemic heart disease and other diseases of the circulatory system: Secondary | ICD-10-CM | POA: Insufficient documentation

## 2020-11-11 DIAGNOSIS — K900 Celiac disease: Secondary | ICD-10-CM

## 2020-11-11 NOTE — Patient Instructions (Signed)
Medication Instructions:  Your physician recommends that you continue on your current medications as directed. Please refer to the Current Medication list given to you today.  *If you need a refill on your cardiac medications before your next appointment, please call your pharmacy*   Lab Work: NONE If you have labs (blood work) drawn today and your tests are completely normal, you will receive your results only by: Marland Kitchen MyChart Message (if you have MyChart) OR . A paper copy in the mail If you have any lab test that is abnormal or we need to change your treatment, we will call you to review the results.   Testing/Procedures: NONE   Follow-Up: At [facility], you and your health needs are our priority.  As part of our continuing mission to provide you with exceptional heart care, we have created designated Provider Care Teams.  These Care Teams include your primary Cardiologist (physician) and Advanced Practice Providers (APPs -  Physician Assistants and Nurse Practitioners) who all work together to provide you with the care you need, when you need it.  We recommend signing up for the patient portal called "MyChart".  Sign up information is provided on this After Visit Summary.  MyChart is used to connect with patients for Virtual Visits (Telemedicine).  Patients are able to view lab/test results, encounter notes, upcoming appointments, etc.  Non-urgent messages can be sent to your provider as well.   To learn more about what you can do with MyChart, go to [email or web address].    Your next appointment:   As needed     Other Instructions If you decide you want to have a Cardiac Calcium Score CT please reach out to the office and let us know.

## 2020-11-11 NOTE — Progress Notes (Signed)
Cardiology Office Note:    Date:  11/11/2020   ID:  Jordan Snyder, DOB Sep 25, 1976, MRN [id number]  PCP:  Fanny Bien, MD  [place] Cardiologist:  No primary care provider on file.  [facility] Electrophysiologist:  None   CC: Prevention Consulted for the evaluation of family history of CAD at the behest of Fanny Bien, MD  History of Present Illness:    Jordan Snyder is a 45 y.o. female with a hx of Celiac disease and prior seizures who presents for evaluation 11/11/20.  Patient notes that she is feeling fine.  Has had no chest pain, chest pressure, chest tightness, chest stinging .   Patient exertion notable for walking her dog and exercises at lunch and feels no symptoms.  No shortness of breath, DOE.  No PND or orthopnea.  No bendopnea, weight gain (15 lb intentional weight loss with working out), leg swelling , or abdominal swelling.  No syncope or near syncope . Notes  no palpitations or funny heart beats.     Patient reports no prior cardiac testing including  echo,  stress test,  heart catheterizations,  cardioversion,  ablations.  No history of pre-eclampsia, early menarche, or prematurity.    Past Medical History:  Diagnosis Date  . Celiac disease   . Gluten free diet   . Right ovarian cyst   . Seizures ([place])   . Vision abnormalities   . Vitamin D deficiency   . Wears contact lenses     Past Surgical History:  Procedure Laterality Date  . LAPAROSCOPY Right 08/27/2014   Procedure: LAPAROSCOPY DIAGNOSTIC,RIGHT OOPHORECTOMY;  Surgeon: Marylynn Pearson, MD;  Location: [facility];  Service: Gynecology;  Laterality: Right;  . MOUTH SURGERY      Current Medications: Current Meds  Medication Sig  . ibuprofen (ADVIL,MOTRIN) 800 MG tablet Take 800 mg by mouth every 8 (eight) hours as needed for pain.  Marland Kitchen lamoTRIgine (LAMICTAL) 100 MG tablet Take 100 mg by mouth in the morning and at bedtime.   Current Facility-Administered Medications for the  11/11/20 encounter (Office Visit) with Werner Lean, MD  Medication  . 0.9 %  sodium chloride infusion     Allergies:   Gluten meal   Social History   Socioeconomic History  . Marital status: Divorced    Spouse name: Not on file  . Number of children: Not on file  . Years of education: Not on file  . Highest education level: Not on file  Occupational History  . Not on file  Tobacco Use  . Smoking status: Never Smoker  . Smokeless tobacco: Never Used  Vaping Use  . Vaping Use: Never used  Substance and Sexual Activity  . Alcohol use: Yes    Alcohol/week: 5.0 standard drinks    Types: 5 Glasses of wine per week    Comment: occasional  . Drug use: No  . Sexual activity: Yes  Other Topics Concern  . Not on file  Social History Narrative  . Not on file   Social Determinants of Health   Financial Resource Strain: Not on file  Food Insecurity: Not on file  Transportation Needs: Not on file  Physical Activity: Not on file  Stress: Not on file  Social Connections: Not on file   SOCIAL:  Works as an Nurse, children's  Family History: The patient's family history includes Alzheimer's disease in her maternal grandmother and paternal grandmother; Breast cancer in her maternal grandmother; Diabetes Mellitus II in her  father; Healthy in her brother and mother; Heart attack in her father; Liver cancer in her paternal grandfather. History of coronary artery disease notable for brother at age 7, father at age 14, maternal uncle, maternal grandfather History of heart failure notable for no members. No history of cardiomyopathies including hypertrophic cardiomyopathy, left ventricular non-compaction, or arrhythmogenic right ventricular cardiomyopathy. History of arrhythmia notable for no members. Denies family history of sudden cardiac death including drowning, car accidents, or unexplained deaths in the family. No history of bicuspid aortic valve or aortic aneurysm or  dissection.  ROS:   Please see the history of present illness.     All other systems reviewed and are negative.  EKGs/Labs/Other Studies Reviewed:    The following studies were reviewed today:  EKG:   11/11/2020:  SR rate 79  06/02/2018: SR rate 86 WNL  Recent Labs: No results found for requested labs within last 8760 hours.  Recent Lipid Panel No results found for: CHOL, TRIG, HDL, CHOLHDL, VLDL, LDLCALC, LDLDIRECT   Risk Assessment/Calculations:     N/A  Physical Exam:    VS:  BP 118/82   Pulse 79   Ht 5' 2"  (1.575 m)   Wt 155 lb 3.2 oz (70.4 kg)   BMI 28.39 kg/m     Wt Readings from Last 3 Encounters:  11/11/20 155 lb 3.2 oz (70.4 kg)  03/22/18 160 lb (72.6 kg)  02/14/18 166 lb (75.3 kg)    GEN:  Well nourished, well developed in no acute distress HEENT: Normal NECK: No JVD; No carotid bruits LYMPHATICS: No lymphadenopathy CARDIAC: RRR, no murmurs, rubs, gallops RESPIRATORY:  Clear to auscultation without rales, wheezing or rhonchi  ABDOMEN: Soft, non-tender, non-distended MUSCULOSKELETAL:  No edema; No deformity  SKIN: Warm and dry NEUROLOGIC:  Alert and oriented x 3 PSYCHIATRIC:  Normal affect   ASSESSMENT:    1. Celiac disease   2. Family history of early CAD    PLAN:    Cardiac Preventive Visit Celiac Disease Family history of premature CAD AHA Life's Simple Seven- People with at least five ideal Life's Simple 7 metrics had a 78% reduced risk for heart-related death compared to people with no ideal metrics. - Discussed recommendation of Mediterranean and Dash Diets; discussed the evidence behind vegan diets - Discussed recommendations for 150 minutes weekly exercise - Discussed the important of blood sugar and blood pressure control - offered CAC scoring and aggressive cholesterol management; patient deferred at this time - stress the important of a smoke free environment - though BMI is not a perfect measurement in all patient populations; a BMI  < 30 can improve cardiac outcomes; present BMI is 28 - Patient getting lipids on Thursday; asked if her Primary MD would also forward those results to [place]  PRN follow up unless new symptoms or abnormal test results warranting change in plan  Would be reasonable for  Video Visit Follow up Would be reasonable for  APP Follow up       Medication Adjustments/Labs and Tests Ordered: Current medicines are reviewed at length with the patient today.  Concerns regarding medicines are outlined above.  Orders Placed This Encounter  Procedures  . EKG 12-Lead   No orders of the defined types were placed in this encounter.   Patient Instructions  Medication Instructions:  Your physician recommends that you continue on your current medications as directed. Please refer to the Current Medication list given to you today.  *If you need a refill on your  cardiac medications before your next appointment, please call your pharmacy*   Lab Work: NONE If you have labs (blood work) drawn today and your tests are completely normal, you will receive your results only by: Marland Kitchen MyChart Message (if you have MyChart) OR . A paper copy in the mail If you have any lab test that is abnormal or we need to change your treatment, we will call you to review the results.   Testing/Procedures: NONE   Follow-Up: At [facility], you and your health needs are our priority.  As part of our continuing mission to provide you with exceptional heart care, we have created designated Provider Care Teams.  These Care Teams include your primary Cardiologist (physician) and Advanced Practice Providers (APPs -  Physician Assistants and Nurse Practitioners) who all work together to provide you with the care you need, when you need it.  We recommend signing up for the patient portal called "MyChart".  Sign up information is provided on this After Visit Summary.  MyChart is used to connect with patients for Virtual Visits  (Telemedicine).  Patients are able to view lab/test results, encounter notes, upcoming appointments, etc.  Non-urgent messages can be sent to your provider as well.   To learn more about what you can do with MyChart, go to [email or web address].    Your next appointment:   As needed     Other Instructions If you decide you want to have a Cardiac Calcium Score CT please reach out to the office and let us know.     Signed, Werner Lean, MD  11/11/2020 4:58 PM    [place]

## 2020-11-21 ENCOUNTER — Telehealth: Payer: Self-pay | Admitting: Internal Medicine

## 2020-11-21 NOTE — Telephone Encounter (Signed)
Received labs from Dr. Ronald Lobo  or Outside Clinic Studies (OS[facility]):  Date: 11/20/20 Cholesterol 205 HDL 71 LDL 126 Tgs 44 Creatinine 0.93  Patient's ten year ASCVD risk is 0.5%.  Given family history of CAD; would still offer her the coronary artery calcium score (deferred at last visit).  Otherwise, will continue with dietary and exercise recommendations.  Werner Lean, MD

## 2020-11-28 ENCOUNTER — Telehealth: Payer: Self-pay

## 2020-11-28 NOTE — Telephone Encounter (Signed)
Notified patient of Dr. Oralia Rud recommendations regarding outside lab results (lipid panel).  She expressed that her PCP is following her regarding these labs.

## 2021-09-08 ENCOUNTER — Other Ambulatory Visit: Payer: Self-pay | Admitting: Radiology

## 2021-09-08 DIAGNOSIS — Z1231 Encounter for screening mammogram for malignant neoplasm of breast: Secondary | ICD-10-CM

## 2021-10-14 ENCOUNTER — Ambulatory Visit
Admission: RE | Admit: 2021-10-14 | Discharge: 2021-10-14 | Disposition: A | Discharge status: Home / Self Care | Payer: BC Managed Care – PPO | Source: Ambulatory Visit | Attending: Radiology | Admitting: Radiology

## 2021-10-14 DIAGNOSIS — Z1231 Encounter for screening mammogram for malignant neoplasm of breast: Secondary | ICD-10-CM

## 2021-10-19 ENCOUNTER — Other Ambulatory Visit: Payer: Self-pay | Admitting: Obstetrics and Gynecology

## 2021-10-19 DIAGNOSIS — N6489 Other specified disorders of breast: Secondary | ICD-10-CM

## 2022-02-04 ENCOUNTER — Ambulatory Visit
Admission: RE | Admit: 2022-02-04 | Discharge: 2022-02-04 | Disposition: A | Discharge status: Home / Self Care | Payer: BC Managed Care – PPO | Source: Ambulatory Visit | Attending: Obstetrics and Gynecology | Admitting: Obstetrics and Gynecology

## 2022-02-04 DIAGNOSIS — N6489 Other specified disorders of breast: Secondary | ICD-10-CM

## 2022-09-25 ENCOUNTER — Other Ambulatory Visit: Payer: Self-pay | Admitting: Obstetrics and Gynecology

## 2022-09-25 ENCOUNTER — Inpatient Hospital Stay (HOSPITAL_COMMUNITY)
Admission: AD | Admit: 2022-09-25 | Discharge: 2022-09-25 | Disposition: A | Discharge status: Home / Self Care | Payer: BC Managed Care – PPO | Attending: Obstetrics and Gynecology | Admitting: Obstetrics and Gynecology

## 2022-09-25 DIAGNOSIS — O3680X0 Pregnancy with inconclusive fetal viability, not applicable or unspecified: Secondary | ICD-10-CM

## 2022-09-25 DIAGNOSIS — Z3A00 Weeks of gestation of pregnancy not specified: Secondary | ICD-10-CM | POA: Insufficient documentation

## 2022-09-25 LAB — COMPREHENSIVE METABOLIC PANEL
ALT: 14 U/L (ref 0–44)
AST: 17 U/L (ref 15–41)
Albumin: 3.6 g/dL (ref 3.5–5.0)
Alkaline Phosphatase: 62 U/L (ref 38–126)
Anion gap: 11 (ref 5–15)
BUN: 10 mg/dL (ref 6–20)
CO2: 23 mmol/L (ref 22–32)
Calcium: 9.4 mg/dL (ref 8.9–10.3)
Chloride: 107 mmol/L (ref 98–111)
Creatinine, Ser: 0.86 mg/dL (ref 0.44–1.00)
GFR, Estimated: >60 mL/min (ref >60)
Glucose, Bld: 85 mg/dL (ref 70–99)
Potassium: 3.9 mmol/L (ref 3.5–5.1)
Sodium: 141 mmol/L (ref 135–145)
Total Bilirubin: 0.2 mg/dL — ABNORMAL LOW (ref 0.3–1.2)
Total Protein: 7 g/dL (ref 6.5–8.1)

## 2022-09-25 LAB — CBC WITH DIFFERENTIAL/PLATELET
Abs Immature Granulocytes: 0.02 10*3/uL (ref 0.00–0.07)
Basophils Absolute: 0 10*3/uL (ref 0.0–0.1)
Basophils Relative: 0 %
Eosinophils Absolute: 0.2 10*3/uL (ref 0.0–0.5)
Eosinophils Relative: 2 %
HCT: 41 % (ref 36.0–46.0)
Hemoglobin: 14 g/dL (ref 12.0–15.0)
Immature Granulocytes: 0 %
Lymphocytes Relative: 21 %
Lymphs Abs: 2.1 10*3/uL (ref 0.7–4.0)
MCH: 33.1 pg (ref 26.0–34.0)
MCHC: 34.1 g/dL (ref 30.0–36.0)
MCV: 96.9 fL (ref 80.0–100.0)
Monocytes Absolute: 0.7 10*3/uL (ref 0.1–1.0)
Monocytes Relative: 7 %
Neutro Abs: 7.1 10*3/uL (ref 1.7–7.7)
Neutrophils Relative %: 70 %
Platelets: 312 10*3/uL (ref 150–400)
RBC: 4.23 MIL/uL (ref 3.87–5.11)
RDW: 12 % (ref 11.5–15.5)
WBC: 10.2 10*3/uL (ref 4.0–10.5)
nRBC: 0 % (ref 0.0–0.2)

## 2022-09-25 LAB — HCG, QUANTITATIVE, PREGNANCY: hCG, Beta Chain, Quant, S: 9071 m[IU]/mL — ABNORMAL HIGH (ref <5)

## 2022-09-25 MED ORDER — METHOTREXATE FOR ECTOPIC PREGNANCY
50.0000 mg/m2 | Freq: Once | INTRAMUSCULAR | Status: AC
  Administered 2022-09-25: 87.5 mg via INTRAMUSCULAR
  Filled 2022-09-25: qty 3.5

## 2022-09-25 MED ORDER — METHOTREXATE FOR ECTOPIC PREGNANCY: 50.0000 mg/m2 | Freq: Once | INTRAMUSCULAR | Status: DC

## 2022-09-25 NOTE — MAU Note (Signed)
.  Jordan Snyder is a 46 y.o. at Unknown here in MAU reporting: sent by Fertility doctor for MTX treatment for ectopic pregnancy. Pt deni any pain or bleeding a this time LMP:  Onset of complaint: tosy Pain score: 0 There were no vitals filed for this visit.   Lab orders placed from triage:  cbc,cmet

## 2022-09-25 NOTE — Progress Notes (Signed)
Written and verbal d/c instructions given and understanding voiced. Pt received printout for MTX for Ectopic pregnancy and precautions reviewed. Pt will follow up on Tues & Fri at Dr Charlett Lango office for labs.

## 2022-09-25 NOTE — MAU Note (Signed)
Jordan Snyder called at home to return for MTX. She had requested to go home while waiting on labs. Pt called and went to voice mail. Message left for pt to call. Pt's support person called back immediately and stated they would be in soon. Front desk called for them to call this RN when pt arrives

## 2022-12-09 IMAGING — MG DIGITAL DIAGNOSTIC BILAT W/ TOMO W/ CAD
8 series · 9 of 24 positions shown · non-contrast
Comparison: Previous exam(s).

CLINICAL DATA: Follow-up for probably benign asymmetry. This
probably benign finding was originally identified on screening
mammogram dated 07/10/2019.

EXAM:
DIGITAL DIAGNOSTIC BILATERAL MAMMOGRAM WITH TOMO AND CAD

[R CC synth-2D]
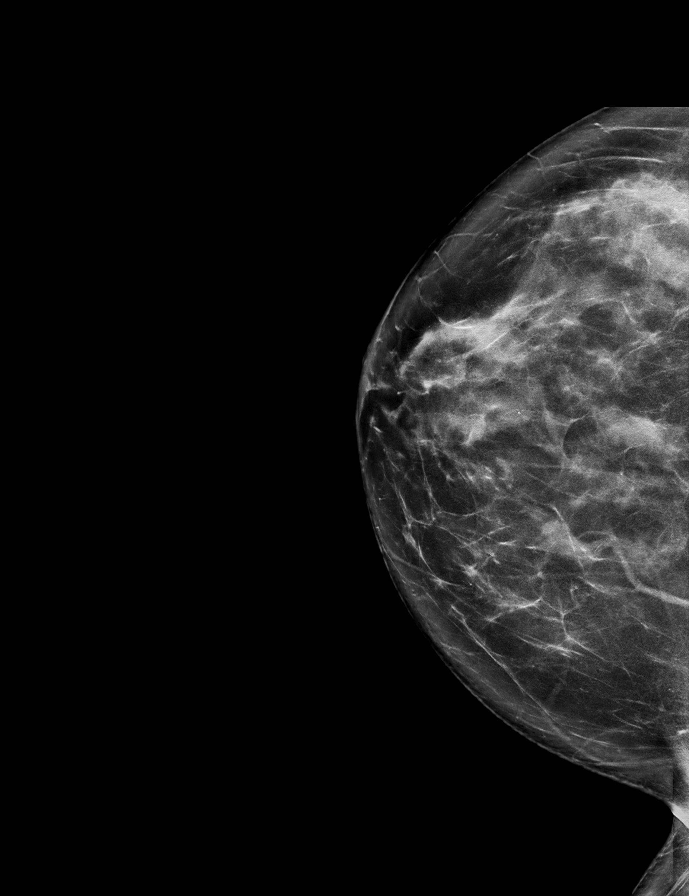

[L MLO synth-2D]
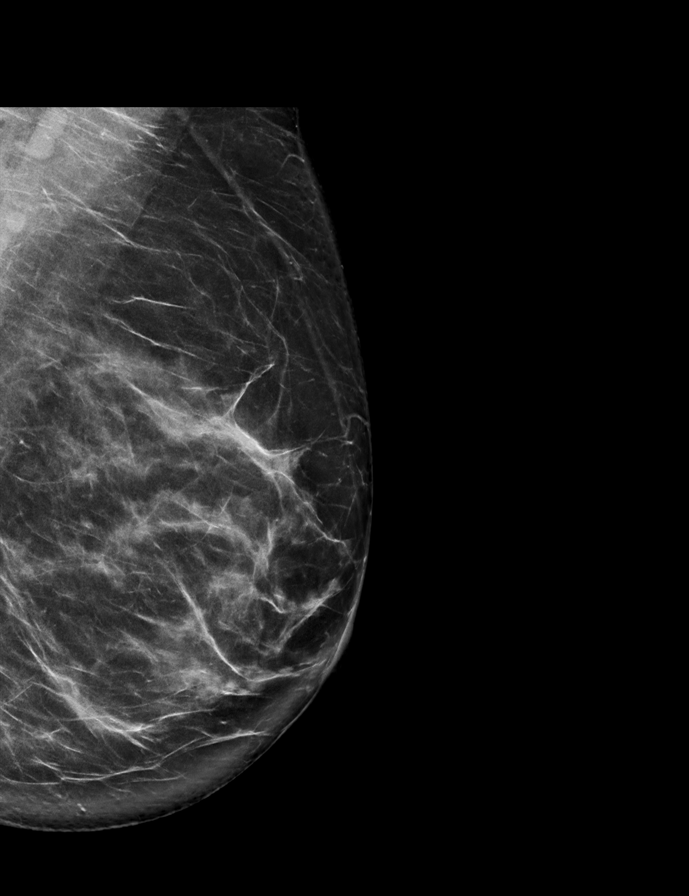

[L CC synth-2D]
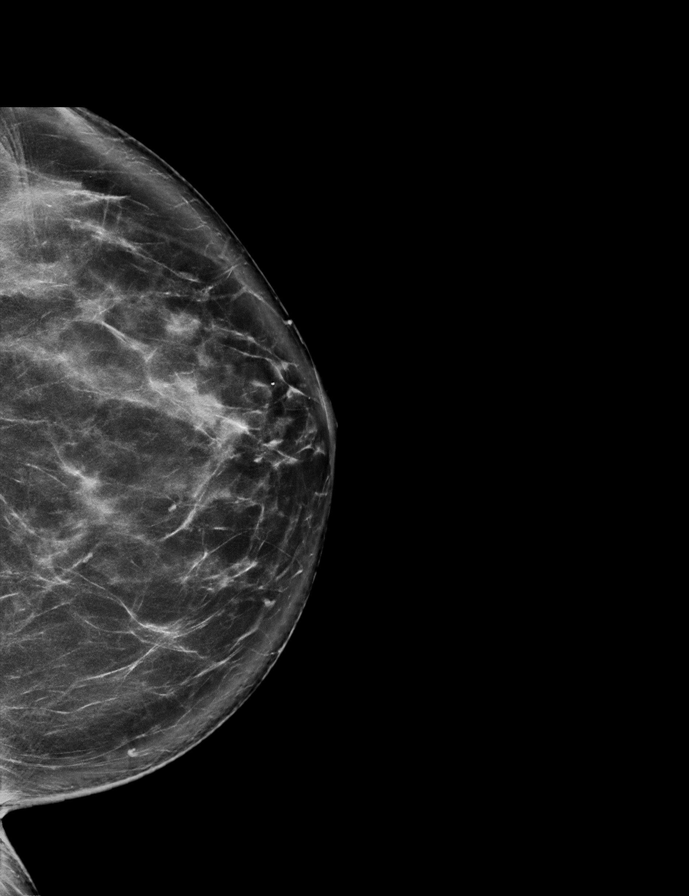

[R MLO synth-2D]
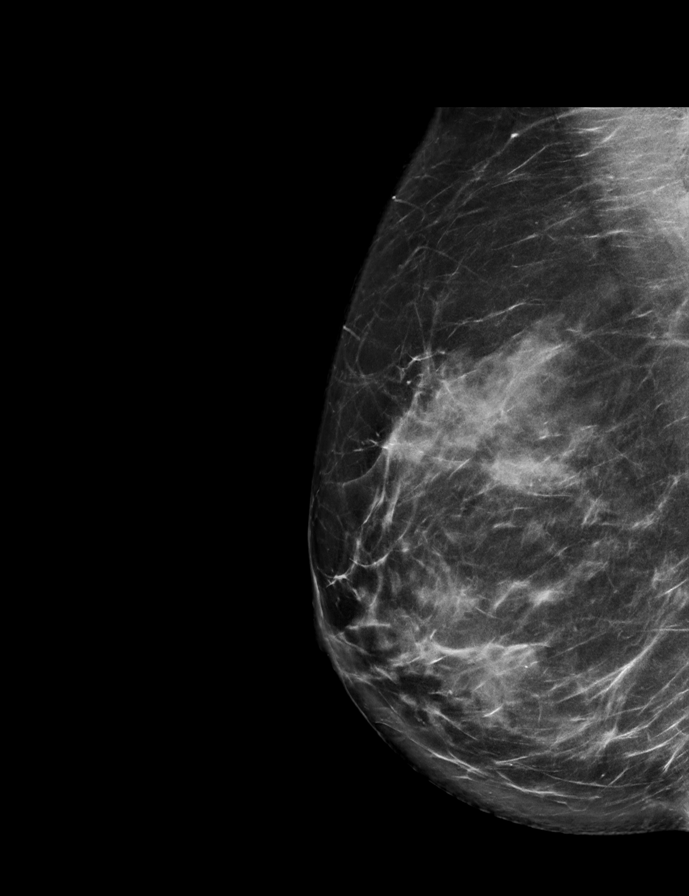

[L CC tomo · 2 of 85 frames shown]
[frame 28/85]
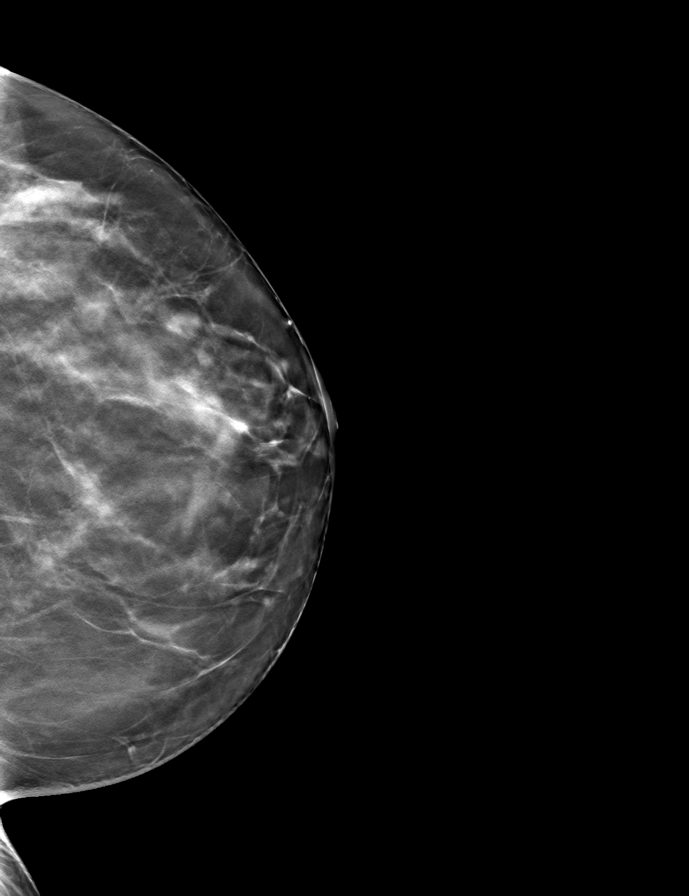
[frame 43/85]
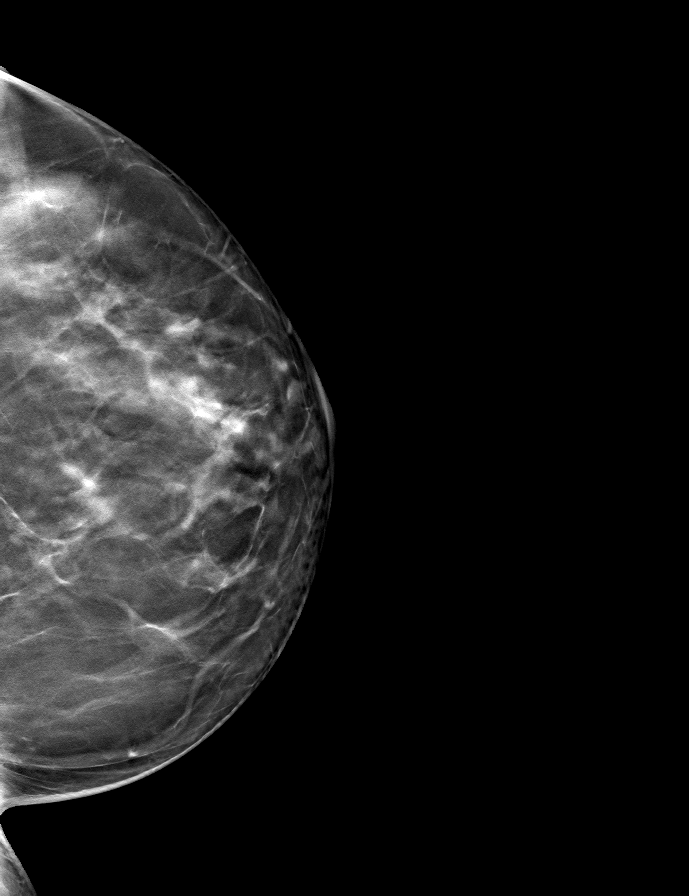

[R MLO tomo · tomo slice 42/83.0]
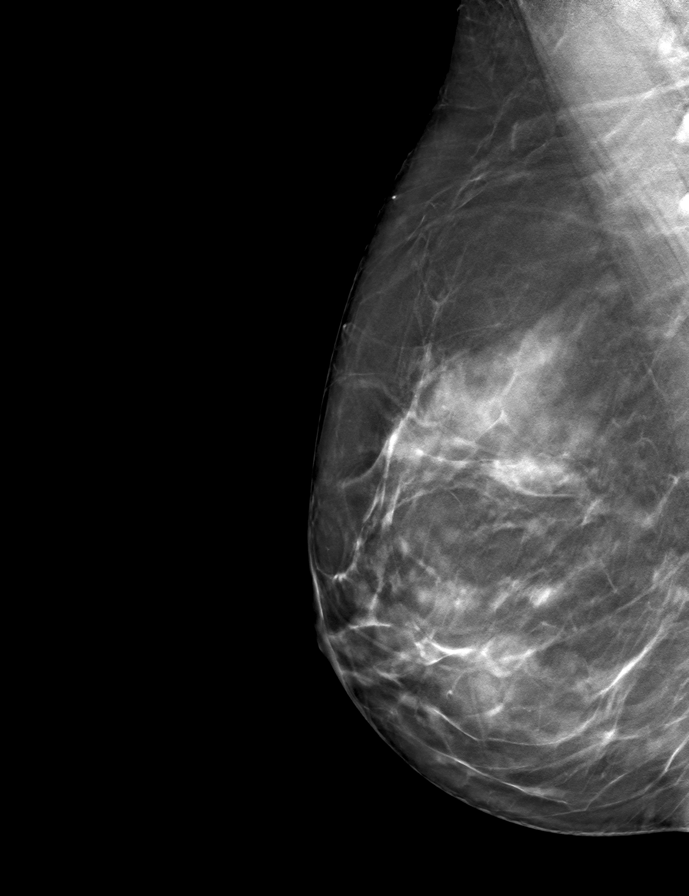

[L MLO tomo · tomo slice 43/85.0]
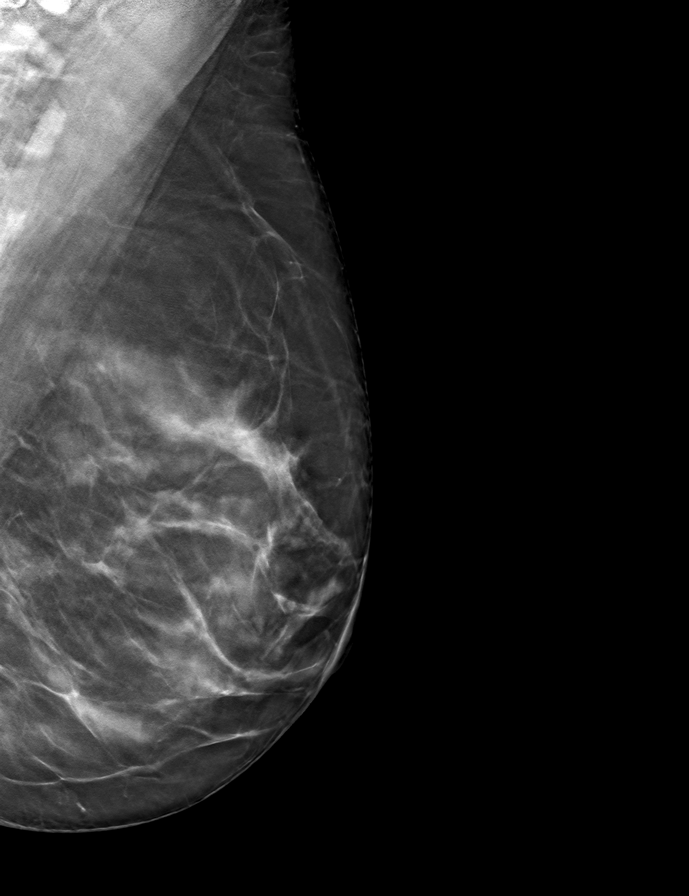

[R CC tomo · tomo slice 41/81.0]
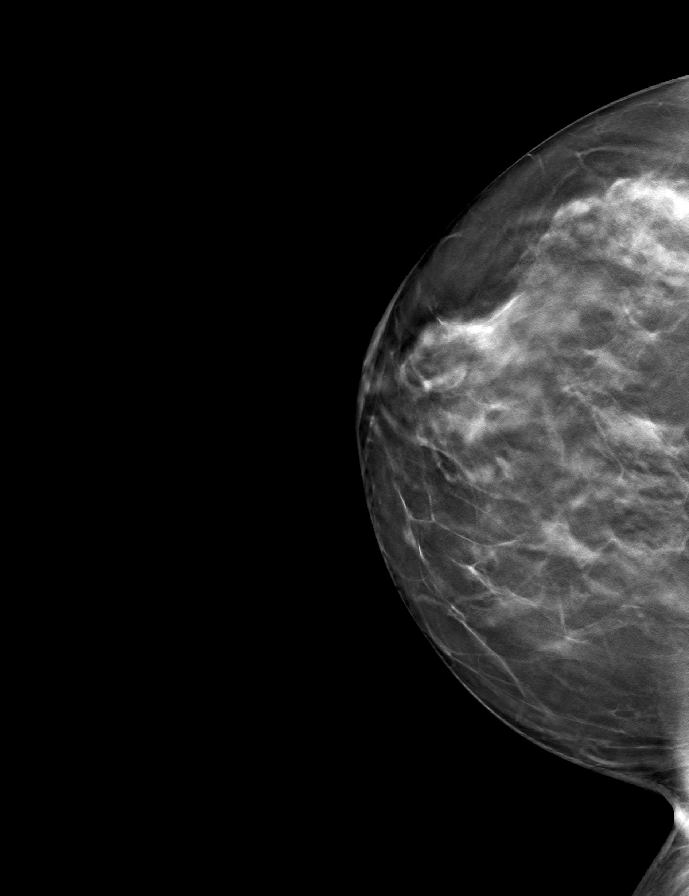

[9 of 24 positions shown; findings below may reference images not displayed]

ACR Breast Density Category c: The breast tissue is heterogeneously
dense, which may obscure small masses.
FINDINGS: The previously questioned asymmetry within the upper RIGHT breast,
at posterior depth, is stable, again most suggestive of an island of
normal dense fibroglandular tissue.

There are no new dominant masses, suspicious calcifications or
secondary signs of malignancy within either breast.

Mammographic images were processed with CAD.
IMPRESSION: 1. Stable probably benign asymmetry within the RIGHT breast.
Recommend additional follow-up diagnostic mammogram in 12 months to
ensure 2 year stability.
2. No evidence of malignancy within the LEFT breast.

RECOMMENDATION:
Bilateral diagnostic mammogram in 12 months.

I have discussed the findings and recommendations with the patient.
If applicable, a reminder letter will be sent to the patient
regarding the next appointment.

BI-RADS CATEGORY  3: Probably benign.

## 2022-12-15 ENCOUNTER — Ambulatory Visit (INDEPENDENT_AMBULATORY_CARE_PROVIDER_SITE_OTHER): Payer: BC Managed Care – PPO | Admitting: Radiology

## 2022-12-15 ENCOUNTER — Encounter: Payer: Self-pay | Admitting: Radiology

## 2022-12-15 VITALS — BP 124/78 | Ht 60.25 in | Wt 162.0 lb

## 2022-12-15 DIAGNOSIS — Z01419 Encounter for gynecological examination (general) (routine) without abnormal findings: Secondary | ICD-10-CM | POA: Diagnosis not present

## 2022-12-15 DIAGNOSIS — E6609 Other obesity due to excess calories: Secondary | ICD-10-CM

## 2022-12-15 DIAGNOSIS — N912 Amenorrhea, unspecified: Secondary | ICD-10-CM

## 2022-12-15 MED ORDER — PHENTERMINE HCL 37.5 MG PO TABS: 37.5000 mg | ORAL_TABLET | Freq: Every day | ORAL | 2 refills | Status: AC

## 2022-12-15 NOTE — Progress Notes (Signed)
Cola SOPHEAP RODIO 1976/03/07 [id number]   History:  47 y.o. G1P0 presents for annual exam, transfer from [facility]. Doing well after SAB post IVF 12/23. Has not had a period since taking the methotrexate 09/25/22. C/o weight gain after all the IVF meds, has used phentermine in the past with success, interested in trying again.   Gynecologic History No LMP recorded. (Menstrual status: Irregular Periods).   Contraception/Family planning: none Sexually active: yes Last Pap: 08/31/21. Results were: normal Last mammogram: 02/04/22. Results were: normal  Obstetric History OB History  Gravida Para Term Preterm AB Living  1       1 0  SAB IAB Ectopic Multiple Live Births      1        # Outcome Date GA Lbr Len/2nd Weight Sex Delivery Anes PTL Lv  1 Ectopic              The following portions of the patient's history were reviewed and updated as appropriate: allergies, current medications, past family history, past medical history, past social history, past surgical history, and problem list.  Review of Systems Pertinent items noted in HPI and remainder of comprehensive ROS otherwise negative.   Past medical history, past surgical history, family history and social history were all reviewed and documented in the EPIC chart.   Exam:  Vitals:   12/15/22 1349  BP: 124/78  Weight: 162 lb (73.5 kg)  Height: 5' 0.25" (1.53 m)   Body mass index is 31.38 kg/m.  General appearance:  Normal Thyroid:  Symmetrical, normal in size, without palpable masses or nodularity. Respiratory  Auscultation:  Clear without wheezing or rhonchi Cardiovascular  Auscultation:  Regular rate, without rubs, murmurs or gallops  Edema/varicosities:  Not grossly evident Abdominal  Soft,nontender, without masses, guarding or rebound.  Liver/spleen:  No organomegaly noted  Hernia:  None appreciated  Skin  Inspection:  Grossly normal Breasts: Examined lying and sitting.   Right: Without masses, retractions, nipple  discharge or axillary adenopathy.   Left: Without masses, retractions, nipple discharge or axillary adenopathy. Genitourinary   Inguinal/mons:  Normal without inguinal adenopathy  External genitalia:  Normal appearing vulva with no masses, tenderness, or lesions  BUS/Urethra/Skene's glands:  Normal without masses or exudate  Vagina:  Normal appearing with normal color and discharge, no lesions  Cervix:  Normal appearing without discharge or lesions  Uterus:  Normal in size, shape and contour.  Mobile, nontender  Adnexa/parametria:     Rt: Normal in size, without masses or tenderness.   Lt: Normal in size, without masses or tenderness.  Anus and perineum: Normal   Patient informed chaperone available to be present for breast and pelvic exam. Patient has requested no chaperone to be present. Patient has been advised what will be completed during breast and pelvic exam.   Assessment/Plan:   1. Well woman exam with routine gynecological exam Pap due 2025  2. Amenorrhea If labs indicate she is postmenopausal will begin HRT with estrogen patch and micronized progesterone. If not, will uses provera cyclically  - FSH  3. Class 1 obesity due to excess calories without serious comorbidity with body mass index (BMI) of 31.0 to 31.9 in adult Would like to try phentermine again, used in the past with success - phentermine (ADIPEX-P) 37.5 MG tablet; Take 1 tablet (37.5 mg total) by mouth daily before breakfast.  Dispense: 30 tablet; Refill: 2     Discussed SBE, colonoscopy and pap screening as directed/appropriate. Recommend 130mns of exercise  weekly, including weight bearing exercise. Encouraged the use of seatbelts and sunscreen. Return in 1 year for annual or as needed.   [name] B WHNP-BC 2:16 PM 12/15/2022

## 2022-12-16 ENCOUNTER — Other Ambulatory Visit: Payer: Self-pay | Admitting: Radiology

## 2022-12-16 DIAGNOSIS — N912 Amenorrhea, unspecified: Secondary | ICD-10-CM

## 2022-12-16 LAB — FOLLICLE STIMULATING HORMONE: FSH: 8.5 m[IU]/mL

## 2022-12-16 MED ORDER — MEDROXYPROGESTERONE ACETATE 10 MG PO TABS: 10.0000 mg | ORAL_TABLET | Freq: Every day | ORAL | 0 refills | Status: DC

## 2023-04-08 NOTE — Telephone Encounter (Signed)
Call placed to patient to further discuss.  Left message to call  [facility] Triage, [phone], option 4.    MyChart message to patient.   Routing to covering provider for final review.   Cc: Clearnce Hasten, NP

## 2023-12-16 ENCOUNTER — Ambulatory Visit: Payer: 59 | Admitting: Radiology

## 2023-12-28 ENCOUNTER — Ambulatory Visit: Payer: 59 | Admitting: Radiology

## 2023-12-29 ENCOUNTER — Ambulatory Visit: Payer: 59 | Admitting: Radiology

## 2024-01-23 ENCOUNTER — Encounter: Payer: Self-pay | Admitting: Radiology

## 2024-01-23 ENCOUNTER — Ambulatory Visit (INDEPENDENT_AMBULATORY_CARE_PROVIDER_SITE_OTHER): Admitting: Radiology

## 2024-01-23 VITALS — BP 110/70 | HR 73 | Ht 61.0 in | Wt 147.0 lb

## 2024-01-23 DIAGNOSIS — Z01419 Encounter for gynecological examination (general) (routine) without abnormal findings: Secondary | ICD-10-CM | POA: Diagnosis not present

## 2024-01-23 DIAGNOSIS — N951 Menopausal and female climacteric states: Secondary | ICD-10-CM | POA: Diagnosis not present

## 2024-01-23 DIAGNOSIS — Z1331 Encounter for screening for depression: Secondary | ICD-10-CM | POA: Diagnosis not present

## 2024-01-23 MED ORDER — PROGESTERONE MICRONIZED 100 MG PO CAPS: 100.0000 mg | ORAL_CAPSULE | Freq: Every evening | ORAL | 4 refills | Status: AC

## 2024-01-23 MED ORDER — ESTRADIOL 0.05 MG/24HR TD PTTW: 1.0000 | MEDICATED_PATCH | TRANSDERMAL | 4 refills | Status: AC

## 2024-01-23 NOTE — Progress Notes (Signed)
   Jordan Snyder [birth year] [id number]   History: Postmenopausal 48 y.o. presents for annual exam. C/o brain fog, anxiety, hip pain, hot flashes, fatigue. LMP 1 year ago.   Gynecologic History Postmenopausal Last Pap: 2022. Results were: normal HPV neg Last mammogram: 01/2022. Results were: normal Last colonoscopy: 2023   Obstetric History OB History  Gravida Para Term Preterm AB Living  1    1 0  SAB IAB Ectopic Multiple Live Births    1      # Outcome Date GA Lbr Len/2nd Weight Sex Type Anes PTL Lv  1 Ectopic                01/23/2024   11:54 AM  Depression screen PHQ 2/9  Decreased Interest 0  Down, Depressed, Hopeless 0  PHQ - 2 Score 0     The following portions of the patient's history were reviewed and updated as appropriate: allergies, current medications, past family history, past medical history, past social history, past surgical history, and problem list.  Review of Systems Pertinent items noted in HPI and remainder of comprehensive ROS otherwise negative.  Past medical history, past surgical history, family history and social history were all reviewed and documented in the EPIC chart.  Exam:  Vitals:   01/23/24 1149  BP: 110/70  Pulse: 73  SpO2: 100%  Weight: 147 lb (66.7 kg)  Height: 5\' 1"  (1.549 m)   Body mass index is 27.78 kg/m.  General appearance:  Normal Thyroid:  Symmetrical, normal in size, without palpable masses or nodularity. Respiratory  Auscultation:  Clear without wheezing or rhonchi Cardiovascular  Auscultation:  Regular rate, without rubs, murmurs or gallops  Edema/varicosities:  Not grossly evident Abdominal  Soft,nontender, without masses, guarding or rebound.  Liver/spleen:  No organomegaly noted  Hernia:  None appreciated  Skin  Inspection:  Grossly normal Breasts: Examined lying and sitting.   Right: Without masses, retractions, nipple discharge or axillary adenopathy.   Left: Without masses, retractions, nipple  discharge or axillary adenopathy. Genitourinary   Inguinal/mons:  Normal without inguinal adenopathy  External genitalia:  Normal appearing vulva with no masses, tenderness, or lesions  BUS/Urethra/Skene's glands:  Normal  Vagina:  Normal appearing with normal color and discharge, no lesions.   Cervix:  Normal appearing without discharge or lesions  Uterus:  Normal in size, shape and contour.  Midline and mobile, nontender  Adnexa/parametria:     Rt: Normal in size, without masses or tenderness.   Lt: Normal in size, without masses or tenderness.  Anus and perineum: Normal    Ellis Guys, CMA present for exam  Assessment/Plan:   1. Well woman exam with routine gynecological exam (Primary) Pap 2027 Mammo yearly (overdue) Labs with PCP  2. Menopausal symptoms Risks and benefits of HRT reviewed - estradiol (VIVELLE-DOT) 0.05 MG/24HR patch; Place 1 patch (0.05 mg total) onto the skin 2 (two) times a week.  Dispense: 24 patch; Refill: 4 - progesterone (PROMETRIUM) 100 MG capsule; Take 1 capsule (100 mg total) by mouth at bedtime.  Dispense: 90 capsule; Refill: 4   Return in 1 year for annual or sooner prn.  Jordan Snyder B WHNP-BC, 12:13 PM 01/23/2024

## 2025-01-23 ENCOUNTER — Ambulatory Visit: Admitting: Radiology
